# Patient Record
Sex: Female | Born: 1990 | Race: White | Hispanic: No | Marital: Single | Smoking: Current some day smoker
Health system: Southern US, Community
[De-identification: ages and names within clinical notes are randomized; demographics above are authoritative.]

## PROBLEM LIST (undated history)

## (undated) DIAGNOSIS — F32A Depression, unspecified: Secondary | ICD-10-CM

## (undated) DIAGNOSIS — F419 Anxiety disorder, unspecified: Secondary | ICD-10-CM

## (undated) HISTORY — DX: Depression, unspecified: F32.A

## (undated) HISTORY — DX: Anxiety disorder, unspecified: F41.9

## (undated) HISTORY — PX: WISDOM TOOTH EXTRACTION: SHX21

---

## 2015-03-24 ENCOUNTER — Encounter (HOSPITAL_COMMUNITY): Payer: Self-pay | Admitting: *Deleted

## 2015-03-24 ENCOUNTER — Emergency Department (HOSPITAL_COMMUNITY)
Admission: EM | Admit: 2015-03-24 | Discharge: 2015-03-24 | Disposition: A | Discharge status: Home / Self Care | Payer: 59 | Attending: Emergency Medicine | Admitting: Emergency Medicine

## 2015-03-24 ENCOUNTER — Emergency Department (HOSPITAL_COMMUNITY): Payer: 59

## 2015-03-24 DIAGNOSIS — Y998 Other external cause status: Secondary | ICD-10-CM | POA: Insufficient documentation

## 2015-03-24 DIAGNOSIS — S92332A Displaced fracture of third metatarsal bone, left foot, initial encounter for closed fracture: Secondary | ICD-10-CM | POA: Diagnosis not present

## 2015-03-24 DIAGNOSIS — Y9289 Other specified places as the place of occurrence of the external cause: Secondary | ICD-10-CM | POA: Insufficient documentation

## 2015-03-24 DIAGNOSIS — S92342A Displaced fracture of fourth metatarsal bone, left foot, initial encounter for closed fracture: Secondary | ICD-10-CM | POA: Diagnosis not present

## 2015-03-24 DIAGNOSIS — S99922A Unspecified injury of left foot, initial encounter: Secondary | ICD-10-CM | POA: Diagnosis present

## 2015-03-24 DIAGNOSIS — X58XXXA Exposure to other specified factors, initial encounter: Secondary | ICD-10-CM | POA: Insufficient documentation

## 2015-03-24 DIAGNOSIS — Z79899 Other long term (current) drug therapy: Secondary | ICD-10-CM | POA: Insufficient documentation

## 2015-03-24 DIAGNOSIS — S92352A Displaced fracture of fifth metatarsal bone, left foot, initial encounter for closed fracture: Secondary | ICD-10-CM | POA: Insufficient documentation

## 2015-03-24 DIAGNOSIS — Z793 Long term (current) use of hormonal contraceptives: Secondary | ICD-10-CM | POA: Insufficient documentation

## 2015-03-24 DIAGNOSIS — Y9339 Activity, other involving climbing, rappelling and jumping off: Secondary | ICD-10-CM | POA: Diagnosis not present

## 2015-03-24 DIAGNOSIS — S92302A Fracture of unspecified metatarsal bone(s), left foot, initial encounter for closed fracture: Secondary | ICD-10-CM

## 2015-03-24 MED ORDER — HYDROCODONE-ACETAMINOPHEN 5-325 MG PO TABS
2.0000 | ORAL_TABLET | Freq: Once | ORAL | Status: AC
  Administered 2015-03-24: 2 via ORAL
  Filled 2015-03-24: qty 2

## 2015-03-24 MED ORDER — OXYCODONE-ACETAMINOPHEN 5-325 MG PO TABS: 2.0000 | ORAL_TABLET | ORAL | Status: DC | PRN

## 2015-03-24 NOTE — ED Notes (Signed)
Pt jump off of something and landed on both feet. Pt denies pain upon impact however gradually pain and swelling increased to the left foot. Pt denies being able to ambulate on left foot d/t pain

## 2015-03-24 NOTE — Discharge Instructions (Signed)
Metatarsal Fracture A metatarsal fracture is a break in a metatarsal bone. Metatarsal bones connect your toe bones to your ankle bones. CAUSES This type of fracture may be caused by:  A sudden twisting of your foot.  A fall onto your foot.  Overuse or repetitive exercise. RISK FACTORS This condition is more likely to develop in people who:  Play contact sports.  Have a bone disease.  Have a low calcium level. SYMPTOMS Symptoms of this condition include:  Pain that is worse when walking or standing.  Pain when pressing on the foot or moving the toes.  Swelling.  Bruising on the top or bottom of the foot.  A foot that appears shorter than the other one. DIAGNOSIS This condition is diagnosed with a physical exam. You may also have imaging tests, such as:  X-rays.  A CT scan.  MRI. TREATMENT Treatment for this condition depends on its severity and whether a bone has moved out of place. Treatment may involve:  Rest.  Wearing foot support such as a cast, splint, or boot for several weeks.  Using crutches.  Surgery to move bones back into the right position. Surgery is usually needed if there are many pieces of broken bone or bones that are very out of place (displaced fracture).  Physical therapy. This may be needed to help you regain full movement and strength in your foot. You will need to return to your health care provider to have X-rays taken until your bones heal. Your health care provider will look at the X-rays to make sure that your foot is healing well. HOME CARE INSTRUCTIONS  If You Have a Cast:  Do not stick anything inside the cast to scratch your skin. Doing that increases your risk of infection.  Check the skin around the cast every day. Report any concerns to your health care provider. You may put lotion on dry skin around the edges of the cast. Do not apply lotion to the skin underneath the cast.  Keep the cast clean and dry. If You Have a Splint  or a Supportive Boot:  Wear it as directed by your health care provider. Remove it only as directed by your health care provider.  Loosen it if your toes become numb and tingle, or if they turn cold and blue.  Keep it clean and dry. Bathing  Do not take baths, swim, or use a hot tub until your health care provider approves. Ask your health care provider if you can take showers. You may only be allowed to take sponge baths for bathing.  If your health care provider approves bathing and showering, cover the cast or splint with a watertight plastic bag to protect it from water. Do not let the cast or splint get wet. Managing Pain, Stiffness, and Swelling  If directed, apply ice to the injured area (if you have a splint, not a cast).  Put ice in a plastic bag.  Place a towel between your skin and the bag.  Leave the ice on for 20 minutes, 2-3 times per day.  Move your toes often to avoid stiffness and to lessen swelling.  Raise (elevate) the injured area above the level of your heart while you are sitting or lying down. Driving  Do not drive or operate heavy machinery while taking pain medicine.  Do not drive while wearing foot support on a foot that you use for driving. Activity  Return to your normal activities as directed by your health care   provider. Ask your health care provider what activities are safe for you.  Perform exercises as directed by your health care provider or physical therapist. Safety  Do not use the injured foot to support your body weight until your health care provider says that you can. Use crutches as directed by your health care provider. General Instructions  Do not put pressure on any part of the cast or splint until it is fully hardened. This may take several hours.  Do not use any tobacco products, including cigarettes, chewing tobacco, or e-cigarettes. Tobacco can delay bone healing. If you need help quitting, ask your health care  provider.  Take medicines only as directed by your health care provider.  Keep all follow-up visits as directed by your health care provider. This is important. SEEK MEDICAL CARE IF:  You have a fever.  Your cast, splint, or boot is too loose or too tight.  Your cast, splint, or boot is damaged.  Your pain medicine is not helping.  You have pain, tingling, or numbness in your foot that is not going away. SEEK IMMEDIATE MEDICAL CARE IF:  You have severe pain.  You have tingling or numbness in your foot that is getting worse.  Your foot feels cold or becomes numb.  Your foot changes color.   This information is not intended to replace advice given to you by your health care provider. Make sure you discuss any questions you have with your health care provider.   Document Released: 09/20/2001 Document Revised: 05/15/2014 Document Reviewed: 10/25/2013 Elsevier Interactive Patient Education 2016 Elsevier Inc.  

## 2015-03-24 NOTE — ED Provider Notes (Signed)
CSN: [id number]     Arrival date & time 03/24/15  2023 History  By signing my name below, I, Doreatha Martinva Mathews, attest that this documentation has been prepared under the direction and in the presence of Khaleesi Gruel K. Despina Boan, PA-C. Electronically Signed: Doreatha MartinEva Mathews, ED Scribe. 03/24/2015. 10:06 PM.    No chief complaint on file.  The history is provided by the patient. No language interpreter was used.   HPI Comments: Karen Martin is a 25 y.o. female who presents to the Emergency Department complaining of moderate left lateral foot pain and swelling s/p injury that occurred today at 4PM. Pt states that she jumped up and landed on the lateral aspect of her left foot. She denies fall, LOC or head injury. Pt denies taking OTC medications at home to improve symptoms. She states that her pain is worsened with movement, weight bearing and ambulation. NKDA. She denies numbness, heel pain, additional injuries.    No past medical history on file. No past surgical history on file. No family history on file. Social History  Substance Use Topics  . Smoking status: Not on file  . Smokeless tobacco: Not on file  . Alcohol Use: Not on file   OB History    No data available     Review of Systems  Musculoskeletal: Positive for joint swelling and arthralgias.  Neurological: Negative for numbness.  All other systems reviewed and are negative.  Allergies  Review of patient's allergies indicates no known allergies.  Home Medications   Prior to Admission medications   Medication Sig Start Date End Date Taking? Authorizing Provider  albuterol (PROVENTIL HFA;VENTOLIN HFA) 108 (90 Base) MCG/ACT inhaler Inhale 1-2 puffs into the lungs every 6 (six) hours as needed for wheezing or shortness of breath (for exercise induce asthma).   Yes Historical Provider, MD  BIOTIN PO Take 1 tablet by mouth daily.   Yes Historical Provider, MD  drospirenone-ethinyl estradiol (YAZ,GIANVI,LORYNA) 3-0.02 MG tablet Take 1 tablet by  mouth daily.   Yes Historical Provider, MD   BP 139/91 mmHg  Pulse 59  Temp(Src) 98.3 F (36.8 C) (Oral)  Resp 16  SpO2 98% Physical Exam  Constitutional: She is oriented to person, place, and time. She appears well-developed and well-nourished.  HENT:  Head: Normocephalic and atraumatic.  Eyes: Conjunctivae and EOM are normal. Pupils are equal, round, and reactive to light.  Neck: Normal range of motion. Neck supple.  Cardiovascular: Normal rate.   Pulmonary/Chest: Effort normal. No respiratory distress.  Abdominal: She exhibits no distension.  Musculoskeletal: Normal range of motion. She exhibits tenderness.  TTP to the lateral left foot, specifically over the 5th metatarsal head.   Neurological: She is alert and oriented to person, place, and time.  Skin: Skin is warm and dry.  Psychiatric: She has a normal mood and affect. Her behavior is normal.  Nursing note and vitals reviewed.   ED Course  Procedures (including critical care time) DIAGNOSTIC STUDIES: Oxygen Saturation is 98% on RA, normal by my interpretation.    COORDINATION OF CARE: 10:03 PM Discussed treatment plan with pt at bedside which includes XR and pt agreed to plan.   Imaging Review No results found. I have personally reviewed and evaluated these images as part of my medical decision-making.  MDM   Final diagnoses:  None    Patient X-Ray showed minimally displaced fractures though the bases of the 3rd, 4th and 5th metatarsals, with likely intra-articular extension. Pt advised to follow up with orthopedics. Patient  given cam walker, crutches while in ED, conservative therapy recommended and discussed. Patient will be discharged home & is agreeable with above plan. Returns precautions discussed. Pt appears safe for discharge.    Medication List    TAKE these medications        oxyCODONE-acetaminophen 5-325 MG tablet  Commonly known as:  PERCOCET/ROXICET  Take 2 tablets by mouth every 4 (four) hours  as needed for severe pain.      ASK your doctor about these medications        albuterol 108 (90 Base) MCG/ACT inhaler  Commonly known as:  PROVENTIL HFA;VENTOLIN HFA  Inhale 1-2 puffs into the lungs every 6 (six) hours as needed for wheezing or shortness of breath (for exercise induce asthma).     BIOTIN PO  Take 1 tablet by mouth daily.     drospirenone-ethinyl estradiol 3-0.02 MG tablet  Commonly known as:  YAZ,GIANVI,LORYNA  Take 1 tablet by mouth daily.       Meds ordered this encounter  Medications  . drospirenone-ethinyl estradiol (YAZ,GIANVI,LORYNA) 3-0.02 MG tablet    Sig: Take 1 tablet by mouth daily.  Marland Kitchen BIOTIN PO    Sig: Take 1 tablet by mouth daily.  Marland Kitchen albuterol (PROVENTIL HFA;VENTOLIN HFA) 108 (90 Base) MCG/ACT inhaler    Sig: Inhale 1-2 puffs into the lungs every 6 (six) hours as needed for wheezing or shortness of breath (for exercise induce asthma).  Marland Kitchen HYDROcodone-acetaminophen (NORCO/VICODIN) 5-325 MG per tablet 2 tablet    Sig:   . oxyCODONE-acetaminophen (PERCOCET/ROXICET) 5-325 MG tablet    Sig: Take 2 tablets by mouth every 4 (four) hours as needed for severe pain.    Dispense:  20 tablet    Refill:  0    Order Specific Question:  Supervising Provider    Answer:  Jerelyn Scott [[id number]]    I personally performed the services in this documentation, which was scribed in my presence.  The recorded information has been reviewed and considered.   Barnet Pall.  Lonia Skinner [place], PA-C 03/24/15 [id number]  Arby Barrette, MD 03/27/15 1538

## 2015-07-24 ENCOUNTER — Other Ambulatory Visit: Payer: Self-pay | Admitting: Orthopaedic Surgery

## 2015-07-24 DIAGNOSIS — M79672 Pain in left foot: Secondary | ICD-10-CM

## 2015-08-04 ENCOUNTER — Inpatient Hospital Stay: Admission: RE | Admit: 2015-08-04 | Payer: 59 | Source: Ambulatory Visit

## 2015-08-22 ENCOUNTER — Ambulatory Visit (INDEPENDENT_AMBULATORY_CARE_PROVIDER_SITE_OTHER): Payer: 59 | Admitting: Psychiatry

## 2015-08-22 DIAGNOSIS — F1010 Alcohol abuse, uncomplicated: Secondary | ICD-10-CM

## 2015-08-22 DIAGNOSIS — F5002 Anorexia nervosa, binge eating/purging type: Secondary | ICD-10-CM

## 2015-08-22 DIAGNOSIS — Z720 Tobacco use: Secondary | ICD-10-CM

## 2015-09-03 ENCOUNTER — Ambulatory Visit: Payer: 59 | Admitting: Psychiatry

## 2017-05-03 ENCOUNTER — Encounter: Payer: Self-pay | Admitting: Family Medicine

## 2017-05-03 ENCOUNTER — Ambulatory Visit (INDEPENDENT_AMBULATORY_CARE_PROVIDER_SITE_OTHER): Payer: BLUE CROSS/BLUE SHIELD | Admitting: Family Medicine

## 2017-05-03 VITALS — BP 140/72 | HR 68 | Temp 98.2°F | Ht 71.25 in | Wt 216.0 lb

## 2017-05-03 DIAGNOSIS — Z7689 Persons encountering health services in other specified circumstances: Secondary | ICD-10-CM | POA: Diagnosis not present

## 2017-05-03 DIAGNOSIS — R0789 Other chest pain: Secondary | ICD-10-CM

## 2017-05-03 DIAGNOSIS — Z113 Encounter for screening for infections with a predominantly sexual mode of transmission: Secondary | ICD-10-CM | POA: Diagnosis not present

## 2017-05-03 LAB — TSH: TSH: 0.75 u[IU]/mL (ref 0.35–4.50)

## 2017-05-03 LAB — CBC
HCT: 39.3 % (ref 36.0–46.0)
Hemoglobin: 13.3 g/dL (ref 12.0–15.0)
MCHC: 33.9 g/dL (ref 30.0–36.0)
MCV: 95 fl (ref 78.0–100.0)
PLATELETS: 216 10*3/uL (ref 150.0–400.0)
RBC: 4.13 Mil/uL (ref 3.87–5.11)
RDW: 13.7 % (ref 11.5–15.5)
WBC: 5.5 10*3/uL (ref 4.0–10.5)

## 2017-05-03 LAB — T4, FREE: Free T4: 0.75 ng/dL (ref 0.60–1.60)

## 2017-05-03 NOTE — Progress Notes (Signed)
Patient presents to clinic today to establish care.  SUBJECTIVE: PMH: Pt is a 27 yo female with pmh sig for asthma, depression, bulimia, h/o shingles (May 2016), and fx L foot.  Pt was previously seen in [facility].  Chest discomfort: -pt endorses CP off and on x 1-2 months -noted more as a tightness in the middle of her chest that makes it feel like it is hard to take a deep breath -may happen once/ wk.   -pt denies numbness in arms or face.   -pt endorses an episode of emeses during the last time she had the chest discomfort. -pt denies h/o GERD.  Pt denies any recent binging or purging.  Allergies: None  Past Surgical hx: None  Social Hx: Pt is single.  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  Pt endorses alcohol and tobacco use.  She denies drug use.  Family Med Hx: Mom-alive, thyroid mass removed Dad- alive, alcohol abuse   History reviewed. No pertinent surgical history.  Current Outpatient Medications on File Prior to Visit  Medication Sig Dispense Refill  . drospirenone-ethinyl estradiol (YAZ,GIANVI,LORYNA) 3-0.02 MG tablet Take 1 tablet by mouth daily.    Marland Kitchen albuterol (PROVENTIL HFA;VENTOLIN HFA) 108 (90 Base) MCG/ACT inhaler Inhale 1-2 puffs into the lungs every 6 (six) hours as needed for wheezing or shortness of breath (for exercise induce asthma).    Marland Kitchen BIOTIN PO Take 1 tablet by mouth daily.    Marland Kitchen oxyCODONE-acetaminophen (PERCOCET/ROXICET) 5-325 MG tablet Take 2 tablets by mouth every 4 (four) hours as needed for severe pain. (Patient not taking: Reported on 05/03/2017) 20 tablet 0   No current facility-administered medications on file prior to visit.     No Known Allergies  History reviewed. No pertinent family history.  Social History   Socioeconomic History  . Marital status: Single    Spouse name: Not on file  . Number of children: Not on file  . Years of education: Not on file  . Highest education  level: Not on file  Occupational History  . Not on file  Social Needs  . Financial resource strain: Not on file  . Food insecurity:    Worry: Not on file    Inability: Not on file  . Transportation needs:    Medical: Not on file    Non-medical: Not on file  Tobacco Use  . Smoking status: Current Every Day Smoker    Packs/day: 1.00    Years: 8.00    Pack years: 8.00    Types: Cigarettes  . Smokeless tobacco: Never Used  Substance and Sexual Activity  . Alcohol use: Yes    Alcohol/week: 3.6 oz    Types: 6 Cans of beer per week    Comment: 5 times a week  . Drug use: No  . Sexual activity: Not on file  Lifestyle  . Physical activity:    Days per week: Not on file    Minutes per session: Not on file  . Stress: Not on file  Relationships  . Social connections:    Talks on phone: Not on file    Gets together: Not on file    Attends religious service: Not on file    Active member of club or organization: Not on file    Attends meetings of clubs or organizations: Not on file    Relationship status: Not on file  . Intimate partner violence:    Fear of current  or ex partner: Not on file    Emotionally abused: Not on file    Physically abused: Not on file    Forced sexual activity: Not on file  Other Topics Concern  . Not on file  Social History Narrative  . Not on file    ROS General: Denies fever, chills, night sweats, changes in weight, changes in appetite HEENT: Denies headaches, ear pain, changes in vision, rhinorrhea, sore throat CV: Denies CP, palpitations, SOB, orthopnea  +chest tightness Pulm: Denies SOB, cough, wheezing GI: Denies abdominal pain, nausea, vomiting, diarrhea, constipation GU: Denies dysuria, hematuria, frequency, vaginal discharge Msk: Denies muscle cramps, joint pains Neuro: Denies weakness, numbness, tingling Skin: Denies rashes, bruising Psych: Denies depression, anxiety, hallucinations  BP 140/72 (BP Location: Left Arm, Patient Position:  Sitting, Cuff Size: Normal)   Pulse 68   Temp 98.2 F (36.8 C) (Oral)   Ht 5' 11.25" (1.81 m)   Wt 216 lb (98 kg)   BMI 29.91 kg/m   Physical Exam Gen. Pleasant, well developed, well-nourished, in NAD HEENT - [place]/AT, PERRL, no scleral icterus, no nasal drainage, pharynx without erythema or exudate.  TMs normal bilaterally.  No cervical lymphadenopathy. Lungs: no use of accessory muscles, no dullness to percussion, CTAB, no wheezes, rales or rhonchi Cardiovascular: RRR, No r/g/m, no peripheral edema Abdomen: BS present, soft, nontender, nondistended MSK: no TTP of chest wall Neuro:  A&Ox3, CN II-XII intact, normal gait Skin:  Warm, dry, intact, no lesions Psych: normal affect, mood appropriate  No results found for this or any previous visit (from the past 2160 hour(s)).  Assessment/Plan: Chest tightness -discussed possible causes including anxiety, panic attack, asthma, etc. -pt reassured -PHQ 9 score 5 -GAD 7 score 3 -We will continue to monitor  - Plan: TSH, T4, free, CBC (no diff)  Routine screening for STI (sexually transmitted infection)  - Plan: RPR, HIV antibody (with reflex), C. trachomatis/N. gonorrhoeae RNA  Encounter to establish care -We reviewed the PMH, PSH, FH, SH, Meds and Allergies. -We provided refills for any medications we will prescribe as needed. -We addressed current concerns per orders and patient instructions. -We have asked for records for pertinent exams, studies, vaccines and notes from previous providers. -We have advised patient to follow up per instructions below.  F/u in 1 month, sooner if needed.  Abbe AmsterdamShannon Avary Eichenberger, MD

## 2017-05-03 NOTE — Patient Instructions (Signed)
Panic Attack A panic attack is a sudden episode of severe anxiety, fear, or discomfort that causes physical and emotional symptoms. The attack may be in response to something frightening, or it may occur for no known reason. Symptoms of a panic attack can be similar to symptoms of a heart attack or stroke. It is important to see your health care provider when you have a panic attack so that these conditions can be ruled out. A panic attack is a symptom of another condition. Most panic attacks go away with treatment of the underlying problem. If you have panic attacks often, you may have a condition called panic disorder. What are the causes? A panic attack may be caused by:  An extreme, life-threatening situation, such as a war or natural disaster.  An anxiety disorder, such as post-traumatic stress disorder.  Depression.  Certain medical conditions, including heart problems, neurological conditions, and infections.  Certain over-the-counter and prescription medicines.  Illegal drugs that increase heart rate and blood pressure, such as methamphetamine.  Alcohol.  Supplements that increase anxiety.  Panic disorder.  What increases the risk? You are more likely to develop this condition if:  You have an anxiety disorder.  You have another mental health condition.  You take certain medicines.  You use alcohol, illegal drugs, or other substances.  You are under extreme stress.  A life event is causing increased feelings of anxiety and depression.  What are the signs or symptoms? A panic attack starts suddenly, usually lasts about 20 minutes, and occurs with one or more of the following:  A pounding heart.  A feeling that your heart is beating irregularly or faster than normal (palpitations).  Sweating.  Trembling or shaking.  Shortness of breath or feeling smothered.  Feeling choked.  Chest pain or discomfort.  Nausea or a strange feeling in your  stomach.  Dizziness, feeling lightheaded, or feeling like you might faint.  Chills or hot flashes.  Numbness or tingling in your lips, hands, or feet.  Feeling confused, or feeling that you are not yourself.  Fear of losing control or being emotionally unstable.  Fear of dying.  How is this diagnosed? A panic attack is diagnosed with an assessment by your health care provider. During the assessment your health care provider will ask questions about:  Your history of anxiety, depression, and panic attacks.  Your medical history.  Whether you drink alcohol, use illegal drugs, take supplements, or take medicines. Be honest about your substance use.  Your health care provider may also:  Order blood tests or other kinds of tests to rule out serious medical conditions.  Refer you to a mental health professional for further evaluation.  How is this treated? Treatment depends on the cause of the panic attack:  If the cause is a medical problem, your health care provider will either treat that problem or refer you to a specialist.  If the cause is emotional, you may be given anti-anxiety medicines or referred to a counselor. These medicines may reduce how often attacks happen, reduce how severe the attacks are, and lower anxiety.  If the cause is a medicine, your health care provider may tell you to stop the medicine, change your dose, or take a different medicine.  If the cause is a drug, treatment may involve letting the drug wear off and taking medicine to help the drug leave your body or to counteract its effects. Attacks caused by drug abuse may continue even if you stop using   the drug.  Follow these instructions at home:  Take over-the-counter and prescription medicines only as told by your health care provider.  If you feel anxious, limit your caffeine intake.  Take good care of your physical and mental health by: ? Eating a balanced diet that includes plenty of fresh  fruits and vegetables, whole grains, lean meats, and low-fat dairy. ? Getting plenty of rest. Try to get 7-8 hours of uninterrupted sleep each night. ? Exercising regularly. Try to get 30 minutes of physical activity at least 5 days a week. ? Not smoking. Talk to your health care provider if you need help quitting. ? Limiting alcohol intake to no more than 1 drink a day for nonpregnant women and 2 drinks a day for men. One drink equals 12 oz of beer, 5 oz of wine, or 1 oz of hard liquor.  Keep all follow-up visits as told by your health care provider. This is important. Panic attacks may have underlying physical or emotional problems that take time to accurately diagnose. Contact a health care provider if:  Your symptoms do not improve, or they get worse.  You are not able to take your medicine as prescribed because of side effects. Get help right away if:  You have serious thoughts about hurting yourself or others.  You have symptoms of a panic attack. Do not drive yourself to the hospital. Have someone else drive you or call an ambulance. If you ever feel like you may hurt yourself or others, or you have thoughts about taking your own life, get help right away. You can go to your nearest emergency department or call:  Your local emergency services (911 in the [place]  A suicide crisis helpline, such as the National Suicide Prevention Lifeline at 1-800-273-8255. This is open 24 hours a day.  Summary  A panic attack is a sign of a serious health or mental health condition. Get help right away. Do not drive yourself to the hospital. Have someone else drive you or call an ambulance.  Always see a health care provider to have the reasons for the panic attack correctly diagnosed.  If your panic attack was caused by a physical problem, follow your health care provider's suggestions for medicine, referral to a specialist, and lifestyle changes.  If your panic attack was caused by an  emotional problem, follow through with counseling from a qualified mental health specialist.  If you feel like you may hurt yourself or others, call 911 and get help right away. This information is not intended to replace advice given to you by your health care provider. Make sure you discuss any questions you have with your health care provider. Document Released: 12/29/2004 Document Revised: 02/07/2016 Document Reviewed: 02/07/2016 Elsevier Interactive Patient Education  2018 Elsevier Inc.  

## 2017-05-04 LAB — HIV ANTIBODY (ROUTINE TESTING W REFLEX): HIV 1&2 Ab, 4th Generation: NONREACTIVE

## 2017-05-04 LAB — C. TRACHOMATIS/N. GONORRHOEAE RNA
C. TRACHOMATIS RNA, TMA: NOT DETECTED
N. gonorrhoeae RNA, TMA: NOT DETECTED

## 2017-05-04 LAB — RPR: RPR Ser Ql: NONREACTIVE

## 2017-05-05 ENCOUNTER — Encounter: Payer: Self-pay | Admitting: Family Medicine

## 2018-02-15 ENCOUNTER — Ambulatory Visit: Payer: BLUE CROSS/BLUE SHIELD | Admitting: Internal Medicine

## 2018-02-15 ENCOUNTER — Other Ambulatory Visit (HOSPITAL_COMMUNITY)
Admission: RE | Admit: 2018-02-15 | Discharge: 2018-02-15 | Disposition: A | Discharge status: Home / Self Care | Payer: BLUE CROSS/BLUE SHIELD | Source: Ambulatory Visit | Attending: Internal Medicine | Admitting: Internal Medicine

## 2018-02-15 ENCOUNTER — Encounter: Payer: Self-pay | Admitting: Internal Medicine

## 2018-02-15 VITALS — BP 120/64 | HR 77 | Temp 98.9°F | Wt 179.3 lb

## 2018-02-15 DIAGNOSIS — Z113 Encounter for screening for infections with a predominantly sexual mode of transmission: Secondary | ICD-10-CM | POA: Insufficient documentation

## 2018-02-15 DIAGNOSIS — Z79899 Other long term (current) drug therapy: Secondary | ICD-10-CM

## 2018-02-15 DIAGNOSIS — Z3041 Encounter for surveillance of contraceptive pills: Secondary | ICD-10-CM

## 2018-02-15 DIAGNOSIS — R110 Nausea: Secondary | ICD-10-CM

## 2018-02-15 LAB — POC URINALSYSI DIPSTICK (AUTOMATED)
BILIRUBIN UA: NEGATIVE
GLUCOSE UA: NEGATIVE
Leukocytes, UA: NEGATIVE
Nitrite, UA: NEGATIVE
Protein, UA: POSITIVE — AB
RBC UA: NEGATIVE
Urobilinogen, UA: 0.2 E.U./dL
pH, UA: 5.5 (ref 5.0–8.0)

## 2018-02-15 LAB — CBC WITH DIFFERENTIAL/PLATELET
BASOS ABS: 0 10*3/uL (ref 0.0–0.1)
Basophils Relative: 0.4 % (ref 0.0–3.0)
EOS ABS: 0 10*3/uL (ref 0.0–0.7)
EOS PCT: 0.1 % (ref 0.0–5.0)
HCT: 40.6 % (ref 36.0–46.0)
HEMOGLOBIN: 13.5 g/dL (ref 12.0–15.0)
Lymphocytes Relative: 10.4 % — ABNORMAL LOW (ref 12.0–46.0)
Lymphs Abs: 1.1 10*3/uL (ref 0.7–4.0)
MCHC: 33.3 g/dL (ref 30.0–36.0)
MCV: 95.1 fl (ref 78.0–100.0)
MONO ABS: 0.6 10*3/uL (ref 0.1–1.0)
Monocytes Relative: 5.7 % (ref 3.0–12.0)
NEUTROS PCT: 83.4 % — AB (ref 43.0–77.0)
Neutro Abs: 8.4 10*3/uL — ABNORMAL HIGH (ref 1.4–7.7)
Platelets: 211 10*3/uL (ref 150.0–400.0)
RBC: 4.27 Mil/uL (ref 3.87–5.11)
RDW: 13.3 % (ref 11.5–15.5)
WBC: 10.1 10*3/uL (ref 4.0–10.5)

## 2018-02-15 LAB — POCT URINE PREGNANCY: Preg Test, Ur: NEGATIVE

## 2018-02-15 MED ORDER — NORETHIN ACE-ETH ESTRAD-FE 1.5-30 MG-MCG PO TABS: 1.0000 | ORAL_TABLET | Freq: Every day | ORAL | 1 refills | Status: DC

## 2018-02-15 MED ORDER — ONDANSETRON 4 MG PO TBDP: 4.0000 mg | ORAL_TABLET | Freq: Three times a day (TID) | ORAL | 0 refills | Status: DC | PRN

## 2018-02-15 NOTE — Patient Instructions (Signed)
This may be a stomach bug that may run its course.   Nausea med if needed Lab today checking for infection sti screen   Check on ocps formulation.  Make future CPX with dr Salomon Fick.

## 2018-02-15 NOTE — Progress Notes (Signed)
Chief Complaint  Patient presents with  . Nausea    since last night. head ache and fatigue. pt would also like have to have std testing as peace of mind. Pt states that she is not pregnant      HPI: Karen Martin 28 y.o. come in for SDA  PCP dr Salomon Fick  NA   One day of sx.   Extreme nausea without vomiting  Fever cough uris no severe abd pain  No hx of same .  lmp none in 3 weeks  And no ocps of 1 week  Ran out and  pharmacy said needed ov . Ran out of  Pills .     Last menses .  Nl for her. Is on Junel ? Which one?   No fever. Vomiting   Achy some   5 year partner.  Denies risk of pregnancy .  But would like to check for STI screen.  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 Has not had the flu vaccine this year concern in the past that he could give the flu. ROS: See pertinent positives and negatives per HPI.  No current chest pain shortness of breath cough runny nose sore throat vomiting diarrhea unusual rashes.  Or acute neurologic sx   No past medical history on file.  No family history on file.  Social History   Socioeconomic History  . Marital status: Single    Spouse name: Not on file  . Number of children: Not on file  . Years of education: Not on file  . Highest education level: Not on file  Occupational History  . Not on file  Social Needs  . Financial resource strain: Not on file  . Food insecurity:    Worry: Not on file    Inability: Not on file  . Transportation needs:    Medical: Not on file    Non-medical: Not on file  Tobacco Use  . Smoking status: Current Every Day Smoker    Packs/day: 1.00    Years: 8.00    Pack years: 8.00    Types: Cigarettes  . Smokeless tobacco: Never Used  Substance and Sexual Activity  . Alcohol use: Yes    Alcohol/week: 6.0 standard drinks    Types: 6 Cans of beer per week    Comment: 5 times a week  . Drug use: No  . Sexual activity: Not  on file  Lifestyle  . Physical activity:    Days per week: Not on file    Minutes per session: Not on file  . Stress: Not on file  Relationships  . Social connections:    Talks on phone: Not on file    Gets together: Not on file    Attends religious service: Not on file    Active member of club or organization: Not on file    Attends meetings of clubs or organizations: Not on file    Relationship status: Not on file  Other Topics Concern  . Not on file  Social History Narrative  . Not on file    Outpatient Medications Prior to Visit  Medication Sig Dispense Refill  . drospirenone-ethinyl estradiol (YAZ,GIANVI,LORYNA) 3-0.02 MG tablet Take 1 tablet by mouth daily.    Marland Kitchen albuterol (PROVENTIL HFA;VENTOLIN HFA) 108 (90 Base) MCG/ACT inhaler Inhale 1-2 puffs into the lungs every 6 (six) hours as needed for wheezing or shortness of breath (for exercise induce  asthma).    Marland Kitchen BIOTIN PO Take 1 tablet by mouth daily.    Marland Kitchen oxyCODONE-acetaminophen (PERCOCET/ROXICET) 5-325 MG tablet Take 2 tablets by mouth every 4 (four) hours as needed for severe pain. (Patient not taking: Reported on 05/03/2017) 20 tablet 0   No facility-administered medications prior to visit.      EXAM:  BP 120/64 (BP Location: Right Arm, Patient Position: Sitting, Cuff Size: Normal)   Pulse 77   Temp 98.9 F (37.2 C) (Oral)   Wt 179 lb 4.8 oz (81.3 kg)   LMP 01/25/2018 (Within Days)   BMI 24.83 kg/m   Body mass index is 24.83 kg/m.  GENERAL: vitals reviewed and listed above, alert, oriented, appears well hydrated and in no acute distress HEENT: atraumatic, conjunctiva  clear, no obvious abnormalities on inspection of external nose and earstm clear  OP : no lesion edema or exudate  NECK: no obvious masses on inspection palpation  LUNGS: clear to auscultation bilaterally, no wheezes, rales or rhonchi, good air movement CV: HRRR, no clubbing cyanosis or  peripheral edema nl cap refill  Abdomen:  Sof,t normal bowel  sounds without hepatosplenomegaly, no guarding rebound or masses no CVA tenderness  Skin nl tatoos  Nl cap refill  MS: moves all extremities without noticeable focal  abnormality PSYCH: pleasant and cooperative, no obvious depression or anxiety Lab Results  Component Value Date   WBC 5.5 05/03/2017   HGB 13.3 05/03/2017   HCT 39.3 05/03/2017   PLT 216.0 05/03/2017   TSH 0.75 05/03/2017   BP Readings from Last 3 Encounters:  02/15/18 120/64  05/03/17 140/72  03/24/15 121/75    ASSESSMENT AND PLAN:  Discussed the following assessment and plan:  Nausea - Plan: CBC with Differential/Platelet, POCT Urinalysis Dipstick (Automated), POCT urine pregnancy  Routine screening for STI (sexually transmitted infection) - Plan: CBC with Differential/Platelet, HIV Antibody (routine testing w rflx), RPR, Urine cytology ancillary only, POCT Urinalysis Dipstick (Automated), POCT urine pregnancy  Medication management  Oral contraceptive use  ran out  see text  - says on junel  not sure which dose so sent in 1 30 ocp refill  Uncertain  cause    Of her sx today  Expectant management. No resp sx like flu . Didn't get flu vaccine  Counseled. About benefit more than risk  Lab etc   cbcd today  -Patient advised to return or notify health care team  if  new concerns arise.  Patient Instructions  This may be a stomach bug that may run its course.   Nausea med if needed Lab today checking for infection sti screen   Check on ocps formulation.  Make future CPX with dr Salomon Fick.  Neta Mends. Jaymi Tinner M.D.

## 2018-02-16 ENCOUNTER — Telehealth: Payer: Self-pay

## 2018-02-16 LAB — RPR: RPR: NONREACTIVE

## 2018-02-16 LAB — HIV ANTIBODY (ROUTINE TESTING W REFLEX): HIV: NONREACTIVE

## 2018-02-16 LAB — URINE CYTOLOGY ANCILLARY ONLY
CHLAMYDIA, DNA PROBE: NEGATIVE
NEISSERIA GONORRHEA: NEGATIVE

## 2018-02-16 NOTE — Telephone Encounter (Signed)
Please advise   Copied from CRM [id number]. Topic: Quick Communication - Other Results (Clinic Use ONLY) >> Feb 16, 2018  9:13 AM Karen Martin wrote: Pt calling back for results of UA done yesterday.  Pt states she is really fatigued and thought she may have kidney infection. Pt saw Dr Fabian Sharp yesterday.

## 2018-02-16 NOTE — Telephone Encounter (Signed)
Pt was seen by Panosh.

## 2018-02-16 NOTE — Telephone Encounter (Signed)
Pt is calling in and stating she thinks might have had a kidney infection and had recently had sex Monday so pt is unsure if test will be accurate pt is worried and states nothing is getting better. Please advise

## 2018-02-16 NOTE — Telephone Encounter (Signed)
Called pt back after speaking with dr.Panosh offered to have her come in and do A UA and send it off to culture and to follow up with Dr.Banks tomorrow. Pt states she has to return back to work tomorrow and if it continues she will follow up with Dr.Banks

## 2018-02-16 NOTE — Telephone Encounter (Signed)
Pt is anxious for results as she is not feeling well. Even thinking she should come back in as she is concerned she may have kidney infection.  Pt would liek results released to my chart. Advised pt still waiting on Dr Fabian Sharp to review  Pt saw Dr Fabian Sharp who will need to release the results

## 2018-02-21 ENCOUNTER — Telehealth: Payer: Self-pay | Admitting: Family Medicine

## 2018-02-21 NOTE — Telephone Encounter (Signed)
Copied from CRM [id number]. Topic: General - Other >> Feb 21, 2018  1:43 PM Gerrianne Scale wrote: Reason for CRM: pt calling about lab results wanting someone to go over it with her she saw it in my chart

## 2018-02-21 NOTE — Telephone Encounter (Signed)
Returned call to pt.  Questioned about the UA results visible in MyChart.  Reported she noted that UA was positive for protein, and questioned what Dr. Salomon Fick feels this is related to?  Also, stated she wanted to make Dr. Salomon Fick aware that she had sexual intercourse 2 days prior to STD testing, and questioned if the testing would be accurate, if taken that soon in reference to having intercourse?  Pt. Is very eager to learn the results of the STD tests.  Advised that Dr. Salomon Fick has not reviewed / signed off on the STD testing, but will make her aware of pt's questions and concerns.

## 2018-02-21 NOTE — Telephone Encounter (Signed)
Labs were not ordered by this provider.

## 2018-02-22 NOTE — Telephone Encounter (Signed)
Resending note to [facility]; PCP did not order the recent labs.  Please review with Dr. Fabian Sharp and contact the pt.

## 2018-02-22 NOTE — Telephone Encounter (Signed)
Released  Lab results that are good. So she can view and  You can tell her from result note reviewed  Hope she is better   If any concerns fu with pcp

## 2018-04-08 ENCOUNTER — Other Ambulatory Visit: Payer: Self-pay | Admitting: Internal Medicine

## 2018-05-04 ENCOUNTER — Other Ambulatory Visit: Payer: Self-pay | Admitting: Internal Medicine

## 2018-05-18 ENCOUNTER — Telehealth: Payer: BLUE CROSS/BLUE SHIELD | Admitting: Nurse Practitioner

## 2018-05-18 DIAGNOSIS — R6889 Other general symptoms and signs: Secondary | ICD-10-CM | POA: Diagnosis not present

## 2018-05-18 DIAGNOSIS — M791 Myalgia, unspecified site: Secondary | ICD-10-CM

## 2018-05-18 NOTE — Progress Notes (Signed)
E visit for Flu like symptoms   We are sorry that you are not feeling well.  Here is how we plan to help! Based on what you have shared with me it looks like you may have flu-like symptoms that should be watched but do not seem to indicate anti-viral treatment.  Influenza or "the flu" is   an infection caused by a respiratory virus. The flu virus is highly contagious and persons who did not receive their yearly flu vaccination may "catch" the flu from close contact.  We have anti-viral medications to treat the viruses that cause this infection. They are not a "cure" and only shorten the course of the infection. These prescriptions are most effective when they are given within the first 2 days of "flu" symptoms. Antiviral medication are indicated if you have a high risk of complications from the flu. You should  also consider an antiviral medication if you are in close contact with someone who is at risk. These medications can help patients avoid complications from the flu  but have side effects that you should know. Possible side effects from Tamiflu or oseltamivir include nausea, vomiting, diarrhea, dizziness, headaches, eye redness, sleep problems or other respiratory symptoms.   Based upon your symptoms and potential risk factors I recommend that you follow the flu symptoms recommendation that I have listed below.  ANYONE WHO HAS FLU SYMPTOMS SHOULD: . Stay home. The flu is highly contagious and going out or to work exposes others! . Be sure to drink plenty of fluids. Water is fine as well as fruit juices, sodas and electrolyte beverages. You may want to stay away from caffeine or alcohol. If you are nauseated, try taking small sips of liquids. How do you know if you are getting enough fluid? Your urine should be a pale yellow or almost colorless. . Get rest. . Taking a steamy shower or using a humidifier may help nasal congestion and ease sore throat pain. Using a saline nasal spray works much  the same way. . Cough drops, hard candies and sore throat lozenges may ease your cough. . Line up a caregiver. Have someone check on you regularly.   GET HELP RIGHT AWAY IF: . You cannot keep down liquids or your medications. . You become short of breath . Your fell like you are going to pass out or loose consciousness. . Your symptoms persist after you have completed your treatment plan MAKE SURE YOU   Understand these instructions.  Will watch your condition.  Will get help right away if you are not doing well or get worse.  Your e-visit answers were reviewed by a board certified advanced clinical practitioner to complete your personal care plan.  Depending on the condition, your plan could have included both over the counter or prescription medications.  If there is a problem please reply  once you have received a response from your provider.  Your safety is important to us.  If you have drug allergies check your prescription carefully.    You can use MyChart to ask questions about today's visit, request a non-urgent call back, or ask for a work or school excuse for 24 hours related to this e-Visit. If it has been greater than 24 hours you will need to follow up with your provider, or enter a new e-Visit to address those concerns.  You will get an e-mail in the next two days asking about your experience.  I hope that your e-visit has been valuable  and will speed your recovery. Thank you for using e-visits.  5-10 minutes spent reviewing and documenting in chart.

## 2018-05-28 ENCOUNTER — Other Ambulatory Visit: Payer: Self-pay | Admitting: Internal Medicine

## 2018-05-30 ENCOUNTER — Other Ambulatory Visit: Payer: Self-pay

## 2018-05-30 NOTE — Telephone Encounter (Signed)
Dr. Banks patient  

## 2018-06-28 ENCOUNTER — Other Ambulatory Visit: Payer: Self-pay

## 2018-06-28 ENCOUNTER — Other Ambulatory Visit: Payer: Self-pay | Admitting: Family Medicine

## 2018-07-25 ENCOUNTER — Other Ambulatory Visit: Payer: Self-pay | Admitting: Family Medicine

## 2018-08-25 ENCOUNTER — Other Ambulatory Visit: Payer: Self-pay | Admitting: Family Medicine

## 2018-09-23 ENCOUNTER — Other Ambulatory Visit: Payer: Self-pay | Admitting: Family Medicine

## 2018-09-23 NOTE — Telephone Encounter (Signed)
Pt needs appointment for further refills 

## 2018-12-05 ENCOUNTER — Telehealth: Payer: Self-pay | Admitting: Family Medicine

## 2018-12-05 NOTE — Telephone Encounter (Signed)
Medication Refill - Medication: drospirenone-ethinyl estradiol (YAZ,GIANVI,LORYNA) 3-0.02 MG tablet    Has the patient contacted their pharmacy? Yes.   (Agent: If no, request that the patient contact the pharmacy for the refill.) (Agent: If yes, whe [id number] - [address], [place] [phone] (Phone) [phone] (Fax)   n and what did the pharmacy advise?)  Preferred Pharmacy (with phone number or street name):   Agent: Please be advised that RX refills may take up to 3 business days. We ask that you follow-up with your pharmacy.

## 2018-12-06 NOTE — Telephone Encounter (Signed)
Left pt a detailed message to call the office and schedule appointment for the birth control

## 2018-12-07 ENCOUNTER — Telehealth (INDEPENDENT_AMBULATORY_CARE_PROVIDER_SITE_OTHER): Payer: BC Managed Care – PPO | Admitting: Family Medicine

## 2018-12-07 DIAGNOSIS — Z308 Encounter for other contraceptive management: Secondary | ICD-10-CM | POA: Diagnosis not present

## 2018-12-07 MED ORDER — NORETHIN ACE-ETH ESTRAD-FE 1.5-30 MG-MCG PO TABS: 1.0000 | ORAL_TABLET | Freq: Every day | ORAL | 6 refills | Status: DC

## 2018-12-07 NOTE — Progress Notes (Signed)
Virtual Visit via Video Note Attempted to contact pt via doxy, however unable 2/2 poor connection.  Pt called via phone. I connected with Rosalea Withrow on 12/07/18 at  1:30 PM EST by a video enabled telemedicine application 2/2 [id number] pandemic and verified that I am speaking with the correct person using two identifiers.  Location patient: car Location provider:work or home office Persons participating in the virtual visit: patient, provider  I discussed the limitations of evaluation and management by telemedicine and the availability of in person appointments. The patient expressed understanding and agreed to proceed.   HPI: Pt requesting refill on Blisovi birth control.  Pt having regular menses monthly.  Endorses lighter periods on OCPs, but duration about the same.  Pt has been off OCPs x 1.5 months.  Denies SOB, LE edema, h/o PE or DVT, tobacco use.   ROS: See pertinent positives and negatives per HPI.  No past medical history on file.  No past surgical history on file.  No family history on file.    Current Outpatient Medications:  .  albuterol (PROVENTIL HFA;VENTOLIN HFA) 108 (90 Base) MCG/ACT inhaler, Inhale 1-2 puffs into the lungs every 6 (six) hours as needed for wheezing or shortness of breath (for exercise induce asthma)., Disp: , Rfl:  .  BIOTIN PO, Take 1 tablet by mouth daily., Disp: , Rfl:  .  BLISOVI FE 1.5/30 1.5-30 MG-MCG tablet, TAKE 1 TABLET BY MOUTH DAILY, Disp: 28 tablet, Rfl: 0 .  drospirenone-ethinyl estradiol (YAZ,GIANVI,LORYNA) 3-0.02 MG tablet, Take 1 tablet by mouth daily., Disp: , Rfl:  .  ondansetron (ZOFRAN-ODT) 4 MG disintegrating tablet, Take 1 tablet (4 mg total) by mouth every 8 (eight) hours as needed for nausea or vomiting., Disp: 15 tablet, Rfl: 0 .  oxyCODONE-acetaminophen (PERCOCET/ROXICET) 5-325 MG tablet, Take 2 tablets by mouth every 4 (four) hours as needed for severe pain. (Patient not taking: Reported on 05/03/2017), Disp: 20 tablet,  Rfl: 0  EXAM:  VITALS per patient if applicable:   GENERAL: alert, oriented, sounds well and in no acute distress  PSYCH/NEURO: pleasant and cooperative, no obvious depression or anxiety, speech and thought processing grossly intact  ASSESSMENT AND PLAN:  Discussed the following assessment and plan:  Encounter for other contraceptive management  - Plan: norethindrone-ethinyl estradiol-iron (BLISOVI FE 1.5/30) 1.5-30 MG-MCG tablet  Pt to schedule CPE when able in the next few months.   I discussed the assessment and treatment plan with the patient. The patient was provided an opportunity to ask questions and all were answered. The patient agreed with the plan and demonstrated an understanding of the instructions.   The patient was advised to call back or seek an in-person evaluation if the symptoms worsen or if the condition fails to improve as anticipated.  I provided 7 minutes of non-face-to-face time during this encounter.   Billie Ruddy, MD

## 2018-12-20 ENCOUNTER — Other Ambulatory Visit: Payer: Self-pay

## 2018-12-20 DIAGNOSIS — Z20822 Contact with and (suspected) exposure to covid-19: Secondary | ICD-10-CM

## 2018-12-21 NOTE — Telephone Encounter (Signed)
Pt is scheduled for in office visit on 12/22/2018 at 8.30 am

## 2018-12-22 ENCOUNTER — Encounter: Payer: Self-pay | Admitting: Family Medicine

## 2018-12-22 ENCOUNTER — Other Ambulatory Visit (HOSPITAL_COMMUNITY)
Admission: RE | Admit: 2018-12-22 | Discharge: 2018-12-22 | Disposition: A | Discharge status: Home / Self Care | Payer: BC Managed Care – PPO | Source: Ambulatory Visit | Attending: Family Medicine | Admitting: Family Medicine

## 2018-12-22 ENCOUNTER — Ambulatory Visit: Payer: BC Managed Care – PPO | Admitting: Family Medicine

## 2018-12-22 ENCOUNTER — Other Ambulatory Visit: Payer: Self-pay

## 2018-12-22 VITALS — BP 120/62 | HR 85 | Temp 97.8°F | Wt 200.8 lb

## 2018-12-22 DIAGNOSIS — N760 Acute vaginitis: Secondary | ICD-10-CM | POA: Diagnosis not present

## 2018-12-22 DIAGNOSIS — Z113 Encounter for screening for infections with a predominantly sexual mode of transmission: Secondary | ICD-10-CM

## 2018-12-22 DIAGNOSIS — Z124 Encounter for screening for malignant neoplasm of cervix: Secondary | ICD-10-CM | POA: Insufficient documentation

## 2018-12-22 LAB — NOVEL CORONAVIRUS, NAA: SARS-CoV-2, NAA: NOT DETECTED

## 2018-12-22 LAB — POCT URINALYSIS DIPSTICK
Bilirubin, UA: NEGATIVE
Blood, UA: NEGATIVE
Glucose, UA: NEGATIVE
Ketones, UA: NEGATIVE
Leukocytes, UA: NEGATIVE
Nitrite, UA: NEGATIVE
Protein, UA: NEGATIVE
Spec Grav, UA: >=1.03 — AB (ref 1.010–1.025)
Urobilinogen, UA: 0.2 E.U./dL
pH, UA: 5.5 (ref 5.0–8.0)

## 2018-12-22 MED ORDER — METRONIDAZOLE 500 MG PO TABS: 500.0000 mg | ORAL_TABLET | Freq: Two times a day (BID) | ORAL | 0 refills | Status: AC

## 2018-12-22 NOTE — Patient Instructions (Addendum)
Vaginitis Vaginitis is a condition in which the vaginal tissue swells and becomes red (inflamed). This condition is most often caused by a change in the normal balance of bacteria and yeast that live in the vagina. This change causes an overgrowth of certain bacteria or yeast, which causes the inflammation. There are different types of vaginitis, but the most common types are:  Bacterial vaginosis.  Yeast infection (candidiasis).  Trichomoniasis vaginitis. This is a sexually transmitted disease (STD).  Viral vaginitis.  Atrophic vaginitis.  Allergic vaginitis. What are the causes? The cause of this condition depends on the type of vaginitis. It can be caused by:  Bacteria (bacterial vaginosis).  Yeast, which is a fungus (yeast infection).  A parasite (trichomoniasis vaginitis).  A virus (viral vaginitis).  Low hormone levels (atrophic vaginitis). Low hormone levels can occur during pregnancy, breastfeeding, or after menopause.  Irritants, such as bubble baths, scented tampons, and feminine sprays (allergic vaginitis). Other factors can change the normal balance of the yeast and bacteria that live in the vagina. These include:  Antibiotic medicines.  Poor hygiene.  Diaphragms, vaginal sponges, spermicides, birth control pills, and intrauterine devices (IUD).  Sex.  Infection.  Uncontrolled diabetes.  A weakened defense (immune) system. What increases the risk? This condition is more likely to develop in women who:  Smoke.  Use vaginal douches, scented tampons, or scented sanitary pads.  Wear tight-fitting pants.  Wear thong underwear.  Use oral birth control pills or an IUD.  Have sex without a condom.  Have multiple sex partners.  Have an STD.  Frequently use the spermicide nonoxynol-9.  Eat lots of foods high in sugar.  Have uncontrolled diabetes.  Have low estrogen levels.  Have a weakened immune system from an immune disorder or medical  treatment.  Are pregnant or breastfeeding. What are the signs or symptoms? Symptoms vary depending on the cause of the vaginitis. Common symptoms include:  Abnormal vaginal discharge. ? The discharge is white, gray, or yellow with bacterial vaginosis. ? The discharge is thick, white, and cheesy with a yeast infection. ? The discharge is frothy and yellow or greenish with trichomoniasis.  A bad vaginal smell. The smell is fishy with bacterial vaginosis.  Vaginal itching, pain, or swelling.  Sex that is painful.  Pain or burning when urinating. Sometimes there are no symptoms. How is this diagnosed? This condition is diagnosed based on your symptoms and medical history. A physical exam, including a pelvic exam, will also be done. You may also have other tests, including:  Tests to determine the pH level (acidity or alkalinity) of your vagina.  A whiff test, to assess the odor that results when a sample of your vaginal discharge is mixed with a potassium hydroxide solution.  Tests of vaginal fluid. A sample will be examined under a microscope. How is this treated? Treatment varies depending on the type of vaginitis you have. Your treatment may include:  Antibiotic creams or pills to treat bacterial vaginosis and trichomoniasis.  Antifungal medicines, such as vaginal creams or suppositories, to treat a yeast infection.  Medicine to ease discomfort if you have viral vaginitis. Your sexual partner should also be treated.  Estrogen delivered in a cream, pill, suppository, or vaginal ring to treat atrophic vaginitis. If vaginal dryness occurs, lubricants and moisturizing creams may help. You may need to avoid scented soaps, sprays, or douches.  Stopping use of a product that is causing allergic vaginitis. Then using a vaginal cream to treat the symptoms. Follow   these instructions at home: Lifestyle  Keep your genital area clean and dry. Avoid soap, and only rinse the area with  water.  Do not douche or use tampons until your health care provider says it is okay to do so. Use sanitary pads, if needed.  Do not have sex until your health care provider approves. When you can return to sex, practice safe sex and use condoms.  Wipe from front to back. This avoids the spread of bacteria from the rectum to the vagina. General instructions  Take over-the-counter and prescription medicines only as told by your health care provider.  If you were prescribed an antibiotic medicine, take or use it as told by your health care provider. Do not stop taking or using the antibiotic even if you start to feel better.  Keep all follow-up visits as told by your health care provider. This is important. How is this prevented?  Use mild, non-scented products. Do not use things that can irritate the vagina, such as fabric softeners. Avoid the following products if they are scented: ? Feminine sprays. ? Detergents. ? Tampons. ? Feminine hygiene products. ? Soaps or bubble baths.  Let air reach your genital area. ? Wear cotton underwear to reduce moisture buildup. ? Avoid wearing underwear while you sleep. ? Avoid wearing tight pants and underwear or nylons without a cotton panel. ? Avoid wearing thong underwear.  Take off any wet clothing, such as bathing suits, as soon as possible.  Practice safe sex and use condoms. Contact a health care provider if:  You have abdominal pain.  You have a fever.  You have symptoms that last for more than 2-3 days. Get help right away if:  You have a fever and your symptoms suddenly get worse. Summary  Vaginitis is a condition in which the vaginal tissue becomes inflamed.This condition is most often caused by a change in the normal balance of bacteria and yeast that live in the vagina.  Treatment varies depending on the type of vaginitis you have.  Do not douche, use tampons , or have sex until your health care provider approves. When  you can return to sex, practice safe sex and use condoms. This information is not intended to replace advice given to you by your health care provider. Make sure you discuss any questions you have with your health care provider. Document Released: 10/26/2006 Document Revised: 12/11/2016 Document Reviewed: 02/04/2016 Elsevier Patient Education  2020 [employer].  Pap Test Why am I having this test? A Pap test, also called a Pap smear, is a screening test to check for signs of:  Cancer of the vagina, cervix, and uterus. The cervix is the lower part of the uterus that opens into the vagina.  Infection.  Changes that may be a sign that cancer is developing (precancerous changes). Women need this test on a regular basis. In general, you should have a Pap test every 3 years until you reach menopause or age 18. Women aged 30-60 may choose to have their Pap test done at the same time as an HPV (human papillomavirus) test every 5 years (instead of every 3 years). Your health care provider may recommend having Pap tests more or less often depending on your medical conditions and past Pap test results. What kind of sample is taken?  Your health care provider will collect a sample of cells from the surface of your cervix. This will be done using a small cotton swab, plastic spatula, or brush. This  sample is often collected during a pelvic exam, when you are lying on your back on an exam table with feet in footrests (stirrups). In some cases, fluids (secretions) from the cervix or vagina may also be collected. How do I prepare for this test?  Be aware of where you are in your menstrual cycle. If you are menstruating on the day of the test, you may be asked to reschedule.  You may need to reschedule if you have a known vaginal infection on the day of the test.  Follow instructions from your health care provider about: ? Changing or stopping your regular medicines. Some medicines can cause abnormal  test results, such as digitalis and tetracycline. ? Avoiding douching or taking a bath the day before or the day of the test. Tell a health care provider about:  Any allergies you have.  All medicines you are taking, including vitamins, herbs, eye drops, creams, and over-the-counter medicines.  Any blood disorders you have.  Any surgeries you have had.  Any medical conditions you have.  Whether you are pregnant or may be pregnant. How are the results reported? Your test results will be reported as either abnormal or normal. A false-positive result can occur. A false positive is incorrect because it means that a condition is present when it is not. A false-negative result can occur. A false negative is incorrect because it means that a condition is not present when it is. What do the results mean? A normal test result means that you do not have signs of cancer of the vagina, cervix, or uterus. An abnormal result may mean that you have:  Cancer. A Pap test by itself is not enough to diagnose cancer. You will have more tests done in this case.  Precancerous changes in your vagina, cervix, or uterus.  Inflammation of the cervix.  An STD (sexually transmitted disease).  A fungal infection.  A parasite infection. Talk with your health care provider about what your results mean. Questions to ask your health care provider Ask your health care provider, or the department that is doing the test:  When will my results be ready?  How will I get my results?  What are my treatment options?  What other tests do I need?  What are my next steps? Summary  In general, women should have a Pap test every 3 years until they reach menopause or age 28.  Your health care provider will collect a sample of cells from the surface of your cervix. This will be done using a small cotton swab, plastic spatula, or brush.  In some cases, fluids (secretions) from the cervix or vagina may also be  collected. This information is not intended to replace advice given to you by your health care provider. Make sure you discuss any questions you have with your health care provider. Document Released: 03/21/2002 Document Revised: 09/07/2016 Document Reviewed: 09/07/2016 Elsevier Patient Education  2020 [employer]  Bacterial Vaginosis  Bacterial vaginosis is an infection of the vagina. It happens when too many normal germs (healthy bacteria) grow in the vagina. This infection puts you at risk for infections from sex (STIs). Treating this infection can lower your risk for some STIs. You should also treat this if you are pregnant. It can cause your baby to be born early. Follow these instructions at home: Medicines  Take over-the-counter and prescription medicines only as told by your doctor.  Take or use your antibiotic medicine as told by your doctor.  Do not stop taking or using it even if you start to feel better. General instructions  If you your sexual partner is a woman, tell her that you have this infection. She needs to get treatment if she has symptoms. If you have a female partner, he does not need to be treated.  During treatment: ? Avoid sex. ? Do not douche. ? Avoid alcohol as told. ? Avoid breastfeeding as told.  Drink enough fluid to keep your pee (urine) clear or pale yellow.  Keep your vagina and butt (rectum) clean. ? Wash the area with warm water every day. ? Wipe from front to back after you use the toilet.  Keep all follow-up visits as told by your doctor. This is important. Preventing this condition  Do not douche.  Use only warm water to wash around your vagina.  Use protection when you have sex. This includes: ? Latex condoms. ? Dental dams.  Limit how many people you have sex with. It is best to only have sex with the same person (be monogamous).  Get tested for STIs. Have your partner get tested.  Wear underwear that is cotton or lined with  cotton.  Avoid tight pants and pantyhose. This is most important in summer.  Do not use any products that have nicotine or tobacco in them. These include cigarettes and e-cigarettes. If you need help quitting, ask your doctor.  Do not use illegal drugs.  Limit how much alcohol you drink. Contact a doctor if:  Your symptoms do not get better, even after you are treated.  You have more discharge or pain when you pee (urinate).  You have a fever.  You have pain in your belly (abdomen).  You have pain with sex.  Your bleed from your vagina between periods. Summary  This infection happens when too many germs (bacteria) grow in the vagina.  Treating this condition can lower your risk for some infections from sex (STIs).  You should also treat this if you are pregnant. It can cause early (premature) birth.  Do not stop taking or using your antibiotic medicine even if you start to feel better. This information is not intended to replace advice given to you by your health care provider. Make sure you discuss any questions you have with your health care provider. Document Released: 10/08/2007 Document Revised: 12/11/2016 Document Reviewed: 09/14/2015 Elsevier Patient Education  2020 [employer].

## 2018-12-22 NOTE — Progress Notes (Signed)
Subjective:    Patient ID: Karen Martin, female    DOB: 1990/12/16, 28 y.o.   MRN: [id number]  Chief Complaint  Patient presents with  . Vaginal Discharge    pt states she has been having a vaginal discharge and discomfort and irritation for 3 days    HPI Patient was seen today for acute concern.  Pt with vaginal d/c, irritation, and burning sensation since Monday evening.  Pt also with mild abdominal cramping pt denies smell, nausea, vomiting, back pain.  Endorses unprotected sex 1 month ago.  LMP 11/27.  Has a history of low back pain.  Last episode 2 weeks ago.  Patient has never had a Pap.  No past medical history on file.  No Known Allergies  ROS General: Denies fever, chills, night sweats, changes in weight, changes in appetite HEENT: Denies headaches, ear pain, changes in vision, rhinorrhea, sore throat CV: Denies CP, palpitations, SOB, orthopnea Pulm: Denies SOB, cough, wheezing GI: Denies abdominal pain, nausea, vomiting, diarrhea, constipation GU: Denies dysuria, hematuria, frequency  +vaginal discharge/irritation Msk: Denies muscle cramps, joint pains Neuro: Denies weakness, numbness, tingling Skin: Denies rashes, bruising Psych: Denies depression, anxiety, hallucinations    Objective:    Blood pressure 120/62, pulse 85, temperature 97.8 F (36.6 C), temperature source Temporal, weight 200 lb 12.8 oz (91.1 kg), last menstrual period 12/09/2018, SpO2 99 %.   Gen. Pleasant, well-nourished, in no distress, normal affect   HEENT: [place]/AT, face symmetric, conjunctiva clear, no scleral icterus, PERRLA, nares patent without drainage Lungs: no accessory muscle use Cardiovascular: RRR, noperipheral edema Abdomen: BS present, soft, NT/ND GU: normal external genitalia, urethral meatus normal.  Vagina with erythema, no lesions, cervix with thin white d/c, scant amount of blood noted at os with collection on pap, Uterus normal size, No Adnexal masses.  Normal Anus and  perineum.  Pap collected.  No CVA tenderness Neuro:  A&Ox3, CN II-XII intact, normal gait Skin:  Warm, no lesions/ rash   Wt Readings from Last 3 Encounters:  12/22/18 200 lb 12.8 oz (91.1 kg)  02/15/18 179 lb 4.8 oz (81.3 kg)  05/03/17 216 lb (98 kg)    Lab Results  Component Value Date   WBC 10.1 02/15/2018   HGB 13.5 02/15/2018   HCT 40.6 02/15/2018   PLT 211.0 02/15/2018   TSH 0.75 05/03/2017    Assessment/Plan:  Acute vaginitis  -suspect BV based on exam -will treat with Flagyl. Will change abx based on lab results. -Given handout -Given precautions --UA negative Sg 1.030 - Plan: POC Urinalysis Dipstick, PAP [[place]], metroNIDAZOLE (FLAGYL) 500 MG tablet  Cervical cancer screening  - Plan: PAP [[place]]  Routine screening for STI (sexually transmitted infection)  - Plan: RPR, HIV antibody (with reflex)  F/u as needed  Grier Mitts, MD  This note is not being shared with the patient for the following reason: To prevent harm (release of this note would result in harm to the life or physical safety of the patient or another).

## 2018-12-23 LAB — HIV ANTIBODY (ROUTINE TESTING W REFLEX): HIV 1&2 Ab, 4th Generation: NONREACTIVE

## 2018-12-23 LAB — RPR: RPR Ser Ql: NONREACTIVE

## 2018-12-28 LAB — CYTOLOGY - PAP
Chlamydia: NEGATIVE
Comment: NEGATIVE
Comment: NEGATIVE
Comment: NEGATIVE
Comment: NORMAL
Diagnosis: UNDETERMINED — AB
High risk HPV: NEGATIVE
Neisseria Gonorrhea: NEGATIVE
Trichomonas: NEGATIVE

## 2019-01-15 ENCOUNTER — Other Ambulatory Visit: Payer: Self-pay | Admitting: Family Medicine

## 2019-01-15 DIAGNOSIS — Z308 Encounter for other contraceptive management: Secondary | ICD-10-CM

## 2019-01-16 NOTE — Telephone Encounter (Signed)
Left a message on pt voice mail, advised pt to contact her pharmacy for refill, pt has enough refills in her pharmacy

## 2019-01-17 ENCOUNTER — Ambulatory Visit: Payer: Self-pay

## 2019-01-17 ENCOUNTER — Other Ambulatory Visit: Payer: Self-pay | Admitting: Family Medicine

## 2019-01-17 DIAGNOSIS — Z308 Encounter for other contraceptive management: Secondary | ICD-10-CM

## 2019-01-17 NOTE — Telephone Encounter (Signed)
Please advise 

## 2019-01-17 NOTE — Telephone Encounter (Signed)
Ok to start new pack of pills given serial negative tests.  Possibly having an anovulatory cycle.

## 2019-01-17 NOTE — Telephone Encounter (Signed)
  Pt. Reports she ran out of her birth control pills for 1 month. Last period was Thanksgiving. Has been waiting for her period to start so she could start her new pack. Has done multiple pregnancy tests - all negative.Last pregnancy test was this past Sunday. Wants to know if she should go ahead and start her pills or continue to wait.Please advise pt. Answer Assessment - Initial Assessment Questions 1. TYPE: "What is the name of the birth control pill you are using?"     Generic - I can\'t remember 2. PACK TYPE: "How do you take your birth control pill?"   - 28-Day Cycle/Pack: Takes an active hormone pill days 1-21 and placebo pill on days 21-28   - 28-Day Cycle/21-Day Pack: Takes an active hormone pill days 1-21, followed by a pill-free week   - 3-Month Cycle/Pack: Takes an active pill for 3 months, followed by pill free week or placebo week   - Continuous: Takes an active hormone pill every day, continuously     28  3. INITIATION: "When did you first start taking this birth control pill?"     I've been on it awhile 4. SYMPTOM: "What is the main symptom (or question) you're concerned about?"     Have not had a period 5. ONSET: "When did the  Symptom start?"     Last month 6. VAGINAL BLEEDING: "Are you having any unusual vaginal bleeding?"   - NONE   - SPOTTING: spotting or pinkish / brownish mucous discharge; does not fill panty-liner or pad   - MILD: less than 1 pad / hour; less than patient's usual menstrual bleeding   - MODERATE: 1-2 pads / hour; small-medium blood clots (e.g., pea, grape, small coin)   - SEVERE: soaking 2 or more pads/hour for 2 or more hours; bleeding not contained by pads or  tampons     n/a 7. PAIN: "Is there any pain?" (Scale: 1-10; mild, moderate, severe).     n/a 8. MISSED/LATE PILLS: "Did you miss or take any pills late?" If yes, "When? How many pills?"     Missed 1 month 9. PREGNANCY: "Are you concerned you might be pregnant?" "When was your last menstrual   period?"     No  Protocols used: CONTRACEPTION - BIRTH CONTROL PILLS - COMBINED-A-AH

## 2019-01-20 NOTE — Telephone Encounter (Signed)
Spoke with pt state that she started her menstrual cycle this morning, states that she will start on the new birth control on Sunday.

## 2019-03-03 ENCOUNTER — Ambulatory Visit: Payer: BC Managed Care – PPO | Attending: Internal Medicine

## 2019-03-03 DIAGNOSIS — Z23 Encounter for immunization: Secondary | ICD-10-CM | POA: Insufficient documentation

## 2019-03-03 NOTE — Progress Notes (Signed)
   Covid-19 Vaccination Clinic  Name:  Delayni Streed    MRN: [id number] DOB: March 28, 1990  03/03/2019  Ms. Hulsebus was observed post Covid-19 immunization for 15 minutes without incidence. She was provided with Vaccine Information Sheet and instruction to access the V-Safe system.   Ms. Wickard was instructed to call 911 with any severe reactions post vaccine: Marland Kitchen Difficulty breathing  . Swelling of your face and throat  . A fast heartbeat  . A bad rash all over your body  . Dizziness and weakness    Immunizations Administered    Name Date Dose VIS Date Route   Pfizer COVID-19 Vaccine 03/03/2019 10:01 AM 0.3 mL 12/23/2018 Intramuscular   Manufacturer: [employer], [employer]   Lot: [id number]   NDC: [address]-9

## 2019-03-28 ENCOUNTER — Ambulatory Visit: Payer: BC Managed Care – PPO | Attending: Internal Medicine

## 2019-03-28 DIAGNOSIS — Z23 Encounter for immunization: Secondary | ICD-10-CM

## 2019-03-28 NOTE — Progress Notes (Signed)
   Covid-19 Vaccination Clinic  Name:  Karen Martin    MRN: [id number] DOB: [birth year]  03/28/2019  [name] was observed post Covid-19 immunization for 15 minutes without incident. She was provided with Vaccine Information Sheet and instruction to access the V-Safe system.   [name] was instructed to call 911 with any severe reactions post vaccine: Marland Kitchen Difficulty breathing  . Swelling of face and throat  . A fast heartbeat  . A bad rash all over body  . Dizziness and weakness   Immunizations Administered    Name Date Dose VIS Date Route   Pfizer COVID-19 Vaccine 03/28/2019  9:11 AM 0.3 mL 12/23/2018 Intramuscular   Manufacturer: [employer], [employer]   Lot: [id number]   NDC: [address]-6

## 2019-04-26 ENCOUNTER — Other Ambulatory Visit: Payer: Self-pay | Admitting: Family Medicine

## 2019-04-26 DIAGNOSIS — Z308 Encounter for other contraceptive management: Secondary | ICD-10-CM

## 2019-07-07 ENCOUNTER — Other Ambulatory Visit: Payer: Self-pay

## 2019-07-07 ENCOUNTER — Encounter: Payer: Self-pay | Admitting: Family Medicine

## 2019-07-07 ENCOUNTER — Ambulatory Visit (INDEPENDENT_AMBULATORY_CARE_PROVIDER_SITE_OTHER): Payer: BC Managed Care – PPO | Admitting: Family Medicine

## 2019-07-07 VITALS — BP 120/80 | HR 72 | Temp 97.8°F | Ht 71.0 in | Wt 208.0 lb

## 2019-07-07 DIAGNOSIS — Z13220 Encounter for screening for lipoid disorders: Secondary | ICD-10-CM

## 2019-07-07 DIAGNOSIS — Z0000 Encounter for general adult medical examination without abnormal findings: Secondary | ICD-10-CM

## 2019-07-07 DIAGNOSIS — R87610 Atypical squamous cells of undetermined significance on cytologic smear of cervix (ASC-US): Secondary | ICD-10-CM | POA: Diagnosis not present

## 2019-07-07 DIAGNOSIS — R200 Anesthesia of skin: Secondary | ICD-10-CM

## 2019-07-07 LAB — FOLATE: Folate: 13.4 ng/mL (ref >5.9)

## 2019-07-07 LAB — BASIC METABOLIC PANEL
BUN: 5 mg/dL — ABNORMAL LOW (ref 6–23)
CO2: 25 mEq/L (ref 19–32)
Calcium: 9.5 mg/dL (ref 8.4–10.5)
Chloride: 102 mEq/L (ref 96–112)
Creatinine, Ser: 0.77 mg/dL (ref 0.40–1.20)
GFR: 88.85 mL/min (ref >60.00)
Glucose, Bld: 79 mg/dL (ref 70–99)
Potassium: 4.1 mEq/L (ref 3.5–5.1)
Sodium: 139 mEq/L (ref 135–145)

## 2019-07-07 LAB — CBC WITH DIFFERENTIAL/PLATELET
Basophils Absolute: 0 10*3/uL (ref 0.0–0.1)
Basophils Relative: 0.4 % (ref 0.0–3.0)
Eosinophils Absolute: 0 10*3/uL (ref 0.0–0.7)
Eosinophils Relative: 0.4 % (ref 0.0–5.0)
HCT: 39.2 % (ref 36.0–46.0)
Hemoglobin: 13.3 g/dL (ref 12.0–15.0)
Lymphocytes Relative: 20.2 % (ref 12.0–46.0)
Lymphs Abs: 1.5 10*3/uL (ref 0.7–4.0)
MCHC: 33.8 g/dL (ref 30.0–36.0)
MCV: 94.7 fl (ref 78.0–100.0)
Monocytes Absolute: 0.7 10*3/uL (ref 0.1–1.0)
Monocytes Relative: 9.2 % (ref 3.0–12.0)
Neutro Abs: 5.1 10*3/uL (ref 1.4–7.7)
Neutrophils Relative %: 69.8 % (ref 43.0–77.0)
Platelets: 248 10*3/uL (ref 150.0–400.0)
RBC: 4.14 Mil/uL (ref 3.87–5.11)
RDW: 13.5 % (ref 11.5–15.5)
WBC: 7.3 10*3/uL (ref 4.0–10.5)

## 2019-07-07 LAB — LIPID PANEL
Cholesterol: 164 mg/dL (ref 0–200)
HDL: 82 mg/dL (ref >39.00)
LDL Cholesterol: 72 mg/dL (ref 0–99)
NonHDL: 82.11
Total CHOL/HDL Ratio: 2
Triglycerides: 51 mg/dL (ref 0.0–149.0)
VLDL: 10.2 mg/dL (ref 0.0–40.0)

## 2019-07-07 LAB — HEMOGLOBIN A1C: Hgb A1c MFr Bld: 4.9 % (ref 4.6–6.5)

## 2019-07-07 LAB — VITAMIN B12: Vitamin B-12: 248 pg/mL (ref 211–911)

## 2019-07-07 LAB — T4, FREE: Free T4: 1.15 ng/dL (ref 0.60–1.60)

## 2019-07-07 LAB — TSH: TSH: 1.22 u[IU]/mL (ref 0.35–4.50)

## 2019-07-07 NOTE — Patient Instructions (Addendum)
Preventive Care 29-29 Years Old, Female Preventive care refers to visits with your health care provider and lifestyle choices that can promote health and wellness. This includes:  A yearly physical exam. This may also be called an annual well check.  Regular dental visits and eye exams.  Immunizations.  Screening for certain conditions.  Healthy lifestyle choices, such as eating a healthy diet, getting regular exercise, not using drugs or products that contain nicotine and tobacco, and limiting alcohol use. What can I expect for my preventive care visit? Physical exam Your health care provider will check your:  Height and weight. This may be used to calculate body mass index (BMI), which tells if you are at a healthy weight.  Heart rate and blood pressure.  Skin for abnormal spots. Counseling Your health care provider may ask you questions about your:  Alcohol, tobacco, and drug use.  Emotional well-being.  Home and relationship well-being.  Sexual activity.  Eating habits.  Work and work Statistician.  Method of birth control.  Menstrual cycle.  Pregnancy history. What immunizations do I need?  Influenza (flu) vaccine  This is recommended every year. Tetanus, diphtheria, and pertussis (Tdap) vaccine  You may need a Td booster every 29 years. Varicella (chickenpox) vaccine  You may need this if you have not been vaccinated. Human papillomavirus (HPV) vaccine  If recommended by your health care provider, you may need three doses over 6 months. Measles, mumps, and rubella (MMR) vaccine  You may need at least one dose of MMR. You may also need a second dose. Meningococcal conjugate (MenACWY) vaccine  One dose is recommended if you are age 29-21 years and a first-year college student living in a residence hall, or if you have one of several medical conditions. You may also need additional booster doses. Pneumococcal conjugate (PCV13) vaccine  You may need  this if you have certain conditions and were not previously vaccinated. Pneumococcal polysaccharide (PPSV23) vaccine  You may need one or two doses if you smoke cigarettes or if you have certain conditions. Hepatitis A vaccine  You may need this if you have certain conditions or if you travel or work in places where you may be exposed to hepatitis A. Hepatitis B vaccine  You may need this if you have certain conditions or if you travel or work in places where you may be exposed to hepatitis B. Haemophilus influenzae type b (Hib) vaccine  You may need this if you have certain conditions. You may receive vaccines as individual doses or as more than one vaccine together in one shot (combination vaccines). Talk with your health care provider about the risks and benefits of combination vaccines. What tests do I need?  Blood tests  Lipid and cholesterol levels. These may be checked every 5 years starting at age 29.  Hepatitis C test.  Hepatitis B test. Screening  Diabetes screening. This is done by checking your blood sugar (glucose) after you have not eaten for a while (fasting).  Sexually transmitted disease (STD) testing.  BRCA-related cancer screening. This may be done if you have a family history of breast, ovarian, tubal, or peritoneal cancers.  Pelvic exam and Pap test. This may be done every 3 years starting at age 29. Starting at age 29, this may be done every 5 years if you have a Pap test in combination with an HPV test. Talk with your health care provider about your test results, treatment options, and if necessary, the need for more tests.  Follow these instructions at home: Eating and drinking   Eat a diet that includes fresh fruits and vegetables, whole grains, lean protein, and low-fat dairy.  Take vitamin and mineral supplements as recommended by your health care provider.  Do not drink alcohol if: ? Your health care provider tells you not to drink. ? You are  pregnant, may be pregnant, or are planning to become pregnant.  If you drink alcohol: ? Limit how much you have to 0-1 drink a day. ? Be aware of how much alcohol is in your drink. In the [place] one drink equals one 12 oz bottle of beer (355 mL), one 5 oz glass of wine (148 mL), or one 1 oz glass of hard liquor (44 mL). Lifestyle  Take daily care of your teeth and gums.  Stay active. Exercise for at least 30 minutes on 5 or more days each week.  Do not use any products that contain nicotine or tobacco, such as cigarettes, e-cigarettes, and chewing tobacco. If you need help quitting, ask your health care provider.  If you are sexually active, practice safe sex. Use a condom or other form of birth control (contraception) in order to prevent pregnancy and STIs (sexually transmitted infections). If you plan to become pregnant, see your health care provider for a preconception visit. What's next?  Visit your health care provider once a year for a well check visit.  Ask your health care provider how often you should have your eyes and teeth checked.  Stay up to date on all vaccines. This information is not intended to replace advice given to you by your health care provider. Make sure you discuss any questions you have with your health care provider. Document Revised: 09/09/2017 Document Reviewed: 09/09/2017 Elsevier Patient Education  2020 [employer].

## 2019-07-07 NOTE — Progress Notes (Signed)
Subjective:     Karen Martin is a 29 y.o. female and is here for a comprehensive physical exam. The patient reports problems - numbness in feet.  Pt notes numbness in 2nd and 3rd toes of b/l feet x 2 days.  Pt endorses wearing 3 in. heels daily at work.  Denies pain, injury, edema, or erythema.  Pap done 12/2018 with ASCUS.  Social History   Socioeconomic History  . Marital status: Single    Spouse name: Not on file  . Number of children: Not on file  . Years of education: Not on file  . Highest education level: Not on file  Occupational History  . Not on file  Tobacco Use  . Smoking status: Current Every Day Smoker    Packs/day: 1.00    Years: 8.00    Pack years: 8.00    Types: Cigarettes  . Smokeless tobacco: Never Used  Substance and Sexual Activity  . Alcohol use: Yes    Alcohol/week: 6.0 standard drinks    Types: 6 Cans of beer per week    Comment: 5 times a week  . Drug use: No  . Sexual activity: Not on file  Other Topics Concern  . Not on file  Social History Narrative  . Not on file   Social Determinants of Health   Financial Resource Strain:   . Difficulty of Paying Living Expenses:   Food Insecurity:   . Worried About Charity fundraiser in the Last Year:   . Arboriculturist in the Last Year:   Transportation Needs:   . Film/video editor (Medical):   Marland Kitchen Lack of Transportation (Non-Medical):   Physical Activity:   . Days of Exercise per Week:   . Minutes of Exercise per Session:   Stress:   . Feeling of Stress :   Social Connections:   . Frequency of Communication with Friends and Family:   . Frequency of Social Gatherings with Friends and Family:   . Attends Religious Services:   . Active Member of Clubs or Organizations:   . Attends Archivist Meetings:   Marland Kitchen Marital Status:   Intimate Partner Violence:   . Fear of Current or Ex-Partner:   . Emotionally Abused:   Marland Kitchen Physically Abused:   . Sexually Abused:    Health  Maintenance  Topic Date Due  . Hepatitis C Screening  Never done  . TETANUS/TDAP  Never done  . INFLUENZA VACCINE  08/13/2019  . PAP-Cervical Cytology Screening  12/21/2021  . PAP SMEAR-Modifier  12/21/2021  . COVID-19 Vaccine  Completed  . HIV Screening  Completed    The following portions of the patient's history were reviewed and updated as appropriate: allergies, current medications, past family history, past medical history, past social history, past surgical history and problem list.  Review of Systems Pertinent items noted in HPI and remainder of comprehensive ROS otherwise negative.   Objective:    BP 120/80 (BP Location: Right Arm, Patient Position: Sitting, Cuff Size: Large)   Pulse 72   Temp 97.8 F (36.6 C) (Temporal)   Ht 5\' 11"  (1.803 m)   Wt 208 lb (94.3 kg)   LMP 07/06/2019 (Exact Date)   SpO2 97%   BMI 29.01 kg/m  General appearance: alert, cooperative and no distress Head: Normocephalic, without obvious abnormality, atraumatic Eyes: conjunctivae/corneas clear. PERRL, EOM's intact. Fundi benign. Ears: normal TM's and external ear canals both ears Nose: Nares normal. Septum midline. Mucosa normal.  No drainage or sinus tenderness. Throat: lips, mucosa, and tongue normal; teeth and gums normal Neck: no adenopathy, no carotid bruit, no JVD, supple, symmetrical, trachea midline and thyroid not enlarged, symmetric, no tenderness/mass/nodules Lungs: clear to auscultation bilaterally Heart: regular rate and rhythm, S1, S2 normal, no murmur, click, rub or gallop Abdomen: soft, non-tender; bowel sounds normal; no masses,  no organomegaly Extremities: extremities normal, atraumatic, no cyanosis or edema  Thickening of b/l foot pads R>L.  Numbness increased with palpation of 2nd and 3rd MTP joints b/l. Pulses: 2+ and symmetric Skin: Skin color, texture, turgor normal. No rashes or lesions Lymph nodes: Cervical, supraclavicular, and axillary nodes normal. Neurologic:  Alert and oriented X 3, normal strength and tone. Normal symmetric reflexes. Normal coordination and gait    Assessment:    Healthy female exam with b/l toe numbness.     Plan:     Anticipatory guidance given including wearing seatbelts, smoke detectors in the home, increasing physical activity, increasing p.o. intake of water and vegetables. -PHQ 9 score 5 -GAD 7 score 5 -will obtain labs -pap done 12/2018 with ASC-US, HPV negative.  F/u pap in 1 yr or less advised. -given handout -next CPE in 1 yr See After Visit Summary for Counseling Recommendations    Numbness  -Given location, 2nd and 3rd toes, advised likely 2/2 nerve compression vs morton's neuroma (thickening of nerve). -advised to refrain from wearing heels for the next few wks -consider toe pad to relieve pressure on intermetatarsal plantar n. -for continued or worsening symptoms discussed referral to Podiatry.   - Plan: TSH, T4, Free, Hemoglobin A1c, Vitamin B12, Folate  Screening for cholesterol level  - Plan: Lipid panel  Atypical squamous cells of undetermined significance (ASCUS) on Papanicolaou smear of cervix  -abnormal pap (ASCUS) done 12/2018 -recommend repeating pap.   - Plan: Ambulatory referral to Obstetrics / Gynecology  F/u prn  Abbe Amsterdam, MD

## 2019-07-19 ENCOUNTER — Telehealth: Payer: Self-pay | Admitting: Family Medicine

## 2019-07-19 NOTE — Telephone Encounter (Signed)
Pt was returning Nancy's call about results. Pt needs a call back at [phone]

## 2019-07-19 NOTE — Telephone Encounter (Signed)
Returned call to patient. Result note read to patient. Patient verbalized understanding 

## 2019-08-03 ENCOUNTER — Ambulatory Visit: Payer: Self-pay | Admitting: Family Medicine

## 2019-08-09 ENCOUNTER — Telehealth: Payer: Self-pay | Admitting: *Deleted

## 2019-08-09 NOTE — Telephone Encounter (Addendum)
Pt left VM message stating that she has left several messages and her calls have not been returned. She further stated that she was referred to this office by her PCP and has appt scheduled on Friday for "Procedure 20". She wants to know what to expect during the appt and why she needs a procedure. She is feeling very anxious because she has not been given this information. Per chart review, there have been no documentation of calls from pt to this office. Also, noted that pt was advised on 12/30/18 of abnormal Pap (ASCUS, neg HPV) by Mable Paris, CMA and was recommended to have follow up Pap in 1 yr per Dr. Salomon Fick. Then pt had visit w/Dr. Salomon Fick on 07/07/19 and referral to our office was placed at that time. Confirmed with Dr. Morene Antu that pt does not require any follow up for abnormal Pap since HPV was negative. Next pap is recommended in 3 years due to her age group. Message was sent to Dr. Salomon Fick for additional information regarding referral to this office. Message was left on pt's personal voicemail letting her know I am waiting for additional information regarding her referral. She may not require the appt on Friday 7/30. I will call pt back once I have additional information.   7/29  Message received from Dr. Salomon Fick providing clarification of her referral order. She stated that pt does not require further follow up of abnormal Pap at this time. She placed the order in case pt's next pap would be abnormal. She stated that pt's appt in our office could be cancelled. I called pt and left message on her personal VM with this information as well as also sent a MyChart message with the information. Appointment has been cancelled.

## 2019-08-11 ENCOUNTER — Other Ambulatory Visit: Payer: Self-pay

## 2019-08-11 ENCOUNTER — Ambulatory Visit (INDEPENDENT_AMBULATORY_CARE_PROVIDER_SITE_OTHER): Payer: Self-pay

## 2019-08-11 ENCOUNTER — Ambulatory Visit: Payer: BC Managed Care – PPO | Admitting: Obstetrics and Gynecology

## 2019-08-11 DIAGNOSIS — E538 Deficiency of other specified B group vitamins: Secondary | ICD-10-CM

## 2019-08-11 MED ORDER — CYANOCOBALAMIN 1000 MCG/ML IJ SOLN
1000.0000 ug | Freq: Once | INTRAMUSCULAR | Status: AC
  Administered 2019-08-11: 1000 ug via INTRAMUSCULAR

## 2019-08-11 NOTE — Patient Instructions (Signed)
Health Maintenance Due  Topic Date Due  . Hepatitis C Screening  Never done    Depression screen [facility] 2/9 07/10/2019 05/03/2017  Decreased Interest 0 0  Down, Depressed, Hopeless 1 0  PHQ - 2 Score 1 0  Altered sleeping 1 2  Tired, decreased energy 1 1  Change in appetite 1 1  Feeling bad or failure about yourself  1 1  Trouble concentrating 0 0  Moving slowly or fidgety/restless 0 0  Suicidal thoughts 0 0  PHQ-9 Score 5 5  Difficult doing work/chores Somewhat difficult -

## 2019-08-11 NOTE — Progress Notes (Signed)
Per orders of Karen Saint, MD, injection of B12 given in   deltoid by [name]. Patient tolerated injection well.  Lab Results  Component Value Date   VITAMINB12 248 07/07/2019

## 2019-09-01 ENCOUNTER — Encounter: Payer: Self-pay | Admitting: Family Medicine

## 2019-09-01 ENCOUNTER — Other Ambulatory Visit: Payer: Self-pay

## 2019-09-01 ENCOUNTER — Ambulatory Visit: Payer: Self-pay

## 2019-09-01 ENCOUNTER — Ambulatory Visit: Payer: BC Managed Care – PPO | Admitting: Family Medicine

## 2019-09-01 VITALS — BP 124/80 | HR 82 | Temp 97.9°F | Wt 194.0 lb

## 2019-09-01 DIAGNOSIS — R202 Paresthesia of skin: Secondary | ICD-10-CM

## 2019-09-01 DIAGNOSIS — Z113 Encounter for screening for infections with a predominantly sexual mode of transmission: Secondary | ICD-10-CM | POA: Diagnosis not present

## 2019-09-01 DIAGNOSIS — E538 Deficiency of other specified B group vitamins: Secondary | ICD-10-CM

## 2019-09-01 DIAGNOSIS — Z308 Encounter for other contraceptive management: Secondary | ICD-10-CM | POA: Diagnosis not present

## 2019-09-01 MED ORDER — NORETHIN ACE-ETH ESTRAD-FE 1.5-30 MG-MCG PO TABS: 1.0000 | ORAL_TABLET | Freq: Every day | ORAL | 11 refills | Status: DC

## 2019-09-01 MED ORDER — CYANOCOBALAMIN 1000 MCG/ML IJ SOLN
1000.0000 ug | Freq: Once | INTRAMUSCULAR | Status: AC
  Administered 2019-09-01: 1000 ug via INTRAMUSCULAR

## 2019-09-01 MED ORDER — CYANOCOBALAMIN 1000 MCG/ML IJ SOLN: 1000.0000 ug | Freq: Once | INTRAMUSCULAR | Status: DC

## 2019-09-01 NOTE — Patient Instructions (Signed)
Vitamin B12 Deficiency Vitamin B12 deficiency occurs when the body does not have enough vitamin B12, which is an important vitamin. The body needs this vitamin:  To make red blood cells.  To make DNA. This is the genetic material inside cells.  To help the nerves work properly so they can carry messages from the brain to the body. Vitamin B12 deficiency can cause various health problems, such as a low red blood cell count (anemia) or nerve damage. What are the causes? This condition may be caused by:  Not eating enough foods that contain vitamin B12.  Not having enough stomach acid and digestive fluids to properly absorb vitamin B12 from the food that you eat.  Certain digestive system diseases that make it hard to absorb vitamin B12. These diseases include Crohn's disease, chronic pancreatitis, and cystic fibrosis.  A condition in which the body does not make enough of a protein (intrinsic factor), resulting in too few red blood cells (pernicious anemia).  Having a surgery in which part of the stomach or small intestine is removed.  Taking certain medicines that make it hard for the body to absorb vitamin B12. These medicines include: ? Heartburn medicines (antacids and proton pump inhibitors). ? Certain antibiotic medicines. ? Some medicines that are used to treat diabetes, tuberculosis, gout, or high cholesterol. What increases the risk? The following factors may make you more likely to develop a B12 deficiency:  Being older than age 28.  Eating a vegetarian or vegan diet, especially while you are pregnant.  Eating a poor diet while you are pregnant.  Taking certain medicines.  Having alcoholism. What are the signs or symptoms? In some cases, there are no symptoms of this condition. If the condition leads to anemia or nerve damage, various symptoms can occur, such as:  Weakness.  Fatigue.  Loss of appetite.  Weight loss.  Numbness or tingling in your hands and  feet.  Redness and burning of the tongue.  Confusion or memory problems.  Depression.  Sensory problems, such as color blindness, ringing in the ears, or loss of taste.  Diarrhea or constipation.  Trouble walking. If anemia is severe, symptoms can include:  Shortness of breath.  Dizziness.  Rapid heart rate (tachycardia). How is this diagnosed? This condition may be diagnosed with a blood test to measure the level of vitamin B12 in your blood. You may also have other tests, including:  A group of tests that measure certain characteristics of blood cells (complete blood count, CBC).  A blood test to measure intrinsic factor.  A procedure where a thin tube with a camera on the end is used to look into your stomach or intestines (endoscopy). Other tests may be needed to discover the cause of B12 deficiency. How is this treated? Treatment for this condition depends on the cause. This condition may be treated by:  Changing your eating and drinking habits, such as: ? Eating more foods that contain vitamin B12. ? Drinking less alcohol or no alcohol.  Getting vitamin B12 injections.  Taking vitamin B12 supplements. Your health care provider will tell you which dosage is best for you. Follow these instructions at home: Eating and drinking   Eat lots of healthy foods that contain vitamin B12, including: ? Meats and poultry. This includes beef, pork, chicken, [place], and organ meats, such as liver. ? Seafood. This includes clams, rainbow trout, salmon, tuna, and haddock. ? Eggs. ? Cereal and dairy products that are fortified. This means that vitamin B12  has been added to the food. Check the label on the package to see if the food is fortified. The items listed above may not be a complete list of recommended foods and beverages. Contact a dietitian for more information. General instructions  Get any injections that are prescribed by your health care provider.  Take  supplements only as told by your health care provider. Follow the directions carefully.  Do not drink alcohol if your health care provider tells you not to. In some cases, you may only be asked to limit alcohol use.  Keep all follow-up visits as told by your health care provider. This is important. Contact a health care provider if:  Your symptoms come back. Get help right away if you:  Develop shortness of breath.  Have a rapid heart rate.  Have chest pain.  Become dizzy or lose consciousness. Summary  Vitamin B12 deficiency occurs when the body does not have enough vitamin B12.  The main causes of vitamin B12 deficiency include dietary deficiency, digestive diseases, pernicious anemia, and having a surgery in which part of the stomach or small intestine is removed.  In some cases, there are no symptoms of this condition. If the condition leads to anemia or nerve damage, various symptoms can occur, such as weakness, shortness of breath, and numbness.  Treatment may include getting vitamin B12 injections or taking vitamin B12 supplements. Eat lots of healthy foods that contain vitamin B12. This information is not intended to replace advice given to you by your health care provider. Make sure you discuss any questions you have with your health care provider. Document Revised: 06/17/2018 Document Reviewed: 09/07/2017 Elsevier Patient Education  2020 [employer]  Paresthesia Paresthesia is an abnormal burning or prickling sensation. It is usually felt in the hands, arms, legs, or feet. However, it may occur in any part of the body. Usually, paresthesia is not painful. It may feel like:  Tingling or numbness.  Buzzing.  Itching. Paresthesia may occur without any clear cause, or it may be caused by:  Breathing too quickly (hyperventilation).  Pressure on a nerve.  An underlying medical condition.  Side effects of a medication.  Nutritional deficiencies.  Exposure to  toxic chemicals. Most people experience temporary (transient) paresthesia at some time in their lives. For some people, it may be long-lasting (chronic) because of an underlying medical condition. If you have paresthesia that lasts a long time, you may need to be evaluated by your health care provider. Follow these instructions at home: Alcohol use   Do not drink alcohol if: ? Your health care provider tells you not to drink. ? You are pregnant, may be pregnant, or are planning to become pregnant.  If you drink alcohol: ? Limit how much you use to:  0-1 drink a day for women.  0-2 drinks a day for men. ? Be aware of how much alcohol is in your drink. In the [place] one drink equals one 12 oz bottle of beer (355 mL), one 5 oz glass of wine (148 mL), or one 1 oz glass of hard liquor (44 mL). Nutrition   Eat a healthy diet. This includes: ? Eating foods that are high in fiber, such as fresh fruits and vegetables, whole grains, and beans. ? Limiting foods that are high in fat and processed sugars, such as fried or sweet foods. General instructions  Take over-the-counter and prescription medicines only as told by your health care provider.  Do not use any products that  contain nicotine or tobacco, such as cigarettes and e-cigarettes. These can keep blood from reaching damaged nerves. If you need help quitting, ask your health care provider.  If you have diabetes, work closely with your health care provider to keep your blood sugar under control.  If you have numbness in your feet: ? Check every day for signs of injury or infection. Watch for redness, warmth, and swelling. ? Wear padded socks and comfortable shoes. These help protect your feet.  Keep all follow-up visits as told by your health care provider. This is important. Contact a health care provider if you:  Have paresthesia that gets worse or does not go away.  Have a burning or prickling feeling that gets worse when you  walk.  Have pain, cramps, or dizziness.  Develop a rash. Get help right away if you:  Feel weak.  Have trouble walking or moving.  Have problems with speech, understanding, or vision.  Feel confused.  Cannot control your bladder or bowel movements.  Have numbness after an injury.  Develop new weakness in an arm or leg.  Faint. Summary  Paresthesia is an abnormal burning or prickling sensation that is usually felt in the hands, arms, legs, or feet. It may also occur in other parts of the body.  Paresthesia may occur without any clear cause, or it may be caused by breathing too quickly (hyperventilation), pressure on a nerve, an underlying medical condition, side effects of a medication, nutritional deficiencies, or exposure to toxic chemicals.  If you have paresthesia that lasts a long time, you may need to be evaluated by your health care provider. This information is not intended to replace advice given to you by your health care provider. Make sure you discuss any questions you have with your health care provider. Document Revised: 01/24/2018 Document Reviewed: 01/07/2017 Elsevier Patient Education  2020 [employer].  Managing Loss, Adult People experience loss in many different ways throughout their lives. Events such as moving, changing jobs, and losing friends can create a sense of loss. The loss may be as serious as a major health change, divorce, death of a pet, or death of a loved one. All of these types of loss are likely to create a physical and emotional reaction known as grief. Grief is the result of a major change or an absence of something or someone that you count on. Grief is a normal reaction to loss. A variety of factors can affect your grieving experience, including:  The nature of your loss.  Your relationship to what or whom you lost.  Your understanding of grief and how to manage it.  Your support system. How to manage lifestyle changes Keep to  your normal routine as much as possible.  If you have trouble focusing or doing normal activities, it is acceptable to take some time away from your normal routine.  Spend time with friends and loved ones.  Eat a healthy diet, get plenty of sleep, and rest when you feel tired. How to recognize changes  The way that you deal with your grief will affect your ability to function as you normally do. When grieving, you may experience these changes:  Numbness, shock, sadness, anxiety, anger, denial, and guilt.  Thoughts about death.  Unexpected crying.  A physical sensation of emptiness in your stomach.  Problems sleeping and eating.  Tiredness (fatigue).  Loss of interest in normal activities.  Dreaming about or imagining seeing the person who died.  A need to  remember what or whom you lost.  Difficulty thinking about anything other than your loss for a period of time.  Relief. If you have been expecting the loss for a while, you may feel a sense of relief when it happens. Follow these instructions at home:  Activity Express your feelings in healthy ways, such as:  Talking with others about your loss. It may be helpful to find others who have had a similar loss, such as a support group.  Writing down your feelings in a journal.  Doing physical activities to release stress and emotional energy.  Doing creative activities like painting, sculpting, or playing or listening to music.  Practicing resilience. This is the ability to recover and adjust after facing challenges. Reading some resources that encourage resilience may help you to learn ways to practice those behaviors. General instructions  Be patient with yourself and others. Allow the grieving process to happen, and remember that grieving takes time. ? It is likely that you may never feel completely done with some grief. You may find a way to move on while still cherishing memories and feelings about your  loss. ? Accepting your loss is a process. It can take months or longer to adjust.  Keep all follow-up visits as told by your health care provider. This is important. Where to find support To get support for managing loss:  Ask your health care provider for help and recommendations, such as grief counseling or therapy.  Think about joining a support group for people who are managing a loss. Where to find more information You can find more information about managing loss from:  American Society of Clinical Oncology: www.cancer.net  American Psychological Association: [employer] Contact a health care provider if:  Your grief is extreme and keeps getting worse.  You have ongoing grief that does not improve.  Your body shows symptoms of grief, such as illness.  You feel depressed, anxious, or lonely. Get help right away if:  You have thoughts about hurting yourself or others. If you ever feel like you may hurt yourself or others, or have thoughts about taking your own life, get help right away. You can go to your nearest emergency department or call:  Your local emergency services (911 in the [place]  A suicide crisis helpline, such as the National Suicide Prevention Lifeline at 2621700185. This is open 24 hours a day. Summary  Grief is the result of a major change or an absence of someone or something that you count on. Grief is a normal reaction to loss.  The depth of grief and the period of recovery depend on the type of loss and your ability to adjust to the change and process your feelings.  Processing grief requires patience and a willingness to accept your feelings and talk about your loss with people who are supportive.  It is important to find resources that work for you and to realize that people experience grief differently. There is not one grieving process that works for everyone in the same way.  Be aware that when grief becomes extreme, it can lead to more  severe issues like isolation, depression, anxiety, or suicidal thoughts. Talk with your health care provider if you have any of these issues. This information is not intended to replace advice given to you by your health care provider. Make sure you discuss any questions you have with your health care provider. Document Revised: 03/04/2018 Document Reviewed: 05/14/2016 Elsevier Patient Education  2020 [employer].

## 2019-09-01 NOTE — Progress Notes (Signed)
Subjective:    Patient ID: Karen Martin, female    DOB: 07/08/1990, 29 y.o.   MRN: [id number]  No chief complaint on file.   HPI Patient was seen today for f/u.  Pt requesting STI testing.  Pt endorses recent break up with her boyfriend of 6 yrs.  States spending time with friends to keep her mind off of it.  Planning to see her counselor.  Pt on Aurovela FE OCPs but notes recent unprotected sex.  Requesting hCG testing.  LMP was 08/11/19.     Pt also here for second B12 injection.  Noted some improvement in numbness in foot after receiving first injection.  States the sensation returned a few weeks after the injection.  Patient no longer wearing heels to work.  Following also taking OTC B12.  No past medical history on file.  No Known Allergies  ROS General: Denies fever, chills, night sweats, changes in weight, changes in appetite HEENT: Denies headaches, ear pain, changes in vision, rhinorrhea, sore throat CV: Denies CP, palpitations, SOB, orthopnea Pulm: Denies SOB, cough, wheezing GI: Denies abdominal pain, nausea, vomiting, diarrhea, constipation GU: Denies dysuria, hematuria, frequency, vaginal discharge Msk: Denies muscle cramps, joint pains Neuro: Denies weakness  + numbness, tingling in foot Skin: Denies rashes, bruising Psych: Denies depression, anxiety, hallucinations     Objective:    Blood pressure 124/80, pulse 82, temperature 97.9 F (36.6 C), temperature source Oral, weight 194 lb (88 kg), last menstrual period 08/11/2019, SpO2 98 %.   Gen. Pleasant, well-nourished, in no distress, normal affect   HEENT: [place]/AT, face symmetric, no scleral icterus, PERRLA, EOMI, nares patent without drainage Lungs: no accessory muscle use Cardiovascular: RRR, no peripheral edema Musculoskeletal: No deformities, no cyanosis or clubbing, normal tone Neuro:  A&Ox3, CN II-XII intact, normal gait Skin:  Warm, no lesions/ rash   Wt Readings from Last 3 Encounters:  09/01/19  194 lb (88 kg)  07/07/19 208 lb (94.3 kg)  12/22/18 200 lb 12.8 oz (91.1 kg)    Lab Results  Component Value Date   WBC 7.3 07/07/2019   HGB 13.3 07/07/2019   HCT 39.2 07/07/2019   PLT 248.0 07/07/2019   GLUCOSE 79 07/07/2019   CHOL 164 07/07/2019   TRIG 51.0 07/07/2019   HDL 82.00 07/07/2019   LDLCALC 72 07/07/2019   NA 139 07/07/2019   K 4.1 07/07/2019   CL 102 07/07/2019   CREATININE 0.77 07/07/2019   BUN 5 (L) 07/07/2019   CO2 25 07/07/2019   TSH 1.22 07/07/2019   HGBA1C 4.9 07/07/2019    Assessment/Plan:  Routine screening for STI (sexually transmitted infection)  -Given recent break-up, patient advised to proceed with scheduling appointment with counselor - Plan: HIV antibody (with reflex), RPR, C. trachomatis/N. gonorrhoeae RNA, HCG, Quant, Pregnancy, C. trachomatis/N. gonorrhoeae RNA, RPR, HIV antibody (with reflex)  Paresthesia -Discussed possible causes including vitamin B12 deficiency, Morton's neuroma -For continued symptoms discussed follow-up with podiatry - Plan: cyanocobalamin ((VITAMIN B-12)) injection 1,000 mcg  Vitamin B12 deficiency  - Plan: cyanocobalamin ((VITAMIN B-12)) injection 1,000 mcg  Encounter for other contraceptive management -POC hCG negative.  Discussed options as likely too soon for test to turn positive given expected menses 8/30.  Patient wishes to proceed with quant hCG.  Also advised to check home hCG closer to expected menses. - Plan: norethindrone-ethinyl estradiol-iron (AUROVELA FE 1.5/30) 1.5-30 MG-MCG tablet  F/u prn  Abbe Amsterdam, MD

## 2019-09-06 LAB — RPR: RPR Ser Ql: NONREACTIVE

## 2019-09-06 LAB — C. TRACHOMATIS/N. GONORRHOEAE RNA
C. trachomatis RNA, TMA: NOT DETECTED
N. gonorrhoeae RNA, TMA: NOT DETECTED

## 2019-09-06 LAB — HCG, QUANTITATIVE, PREGNANCY: HCG, Total, QN: <3 m[IU]/mL

## 2019-09-06 LAB — HIV ANTIBODY (ROUTINE TESTING W REFLEX): HIV 1&2 Ab, 4th Generation: NONREACTIVE

## 2020-03-01 DIAGNOSIS — H5213 Myopia, bilateral: Secondary | ICD-10-CM | POA: Diagnosis not present

## 2020-04-05 ENCOUNTER — Encounter: Payer: Self-pay | Admitting: Family Medicine

## 2020-04-05 ENCOUNTER — Other Ambulatory Visit: Payer: Self-pay

## 2020-04-05 ENCOUNTER — Ambulatory Visit: Payer: BC Managed Care – PPO | Admitting: Family Medicine

## 2020-04-05 VITALS — BP 145/90 | HR 68 | Temp 98.3°F | Wt 205.4 lb

## 2020-04-05 DIAGNOSIS — U099 Post covid-19 condition, unspecified: Secondary | ICD-10-CM

## 2020-04-05 DIAGNOSIS — R5382 Chronic fatigue, unspecified: Secondary | ICD-10-CM

## 2020-04-05 DIAGNOSIS — L819 Disorder of pigmentation, unspecified: Secondary | ICD-10-CM | POA: Diagnosis not present

## 2020-04-05 DIAGNOSIS — R030 Elevated blood-pressure reading, without diagnosis of hypertension: Secondary | ICD-10-CM | POA: Diagnosis not present

## 2020-04-05 DIAGNOSIS — G9332 Myalgic encephalomyelitis/chronic fatigue syndrome: Secondary | ICD-10-CM

## 2020-04-05 DIAGNOSIS — E538 Deficiency of other specified B group vitamins: Secondary | ICD-10-CM

## 2020-04-05 LAB — CBC WITH DIFFERENTIAL/PLATELET
Basophils Absolute: 0 10*3/uL (ref 0.0–0.1)
Basophils Relative: 0.6 % (ref 0.0–3.0)
Eosinophils Absolute: 0.1 10*3/uL (ref 0.0–0.7)
Eosinophils Relative: 0.9 % (ref 0.0–5.0)
HCT: 39.7 % (ref 36.0–46.0)
Hemoglobin: 13.6 g/dL (ref 12.0–15.0)
Lymphocytes Relative: 19.8 % (ref 12.0–46.0)
Lymphs Abs: 1.6 10*3/uL (ref 0.7–4.0)
MCHC: 34.3 g/dL (ref 30.0–36.0)
MCV: 94.1 fl (ref 78.0–100.0)
Monocytes Absolute: 0.7 10*3/uL (ref 0.1–1.0)
Monocytes Relative: 8.5 % (ref 3.0–12.0)
Neutro Abs: 5.8 10*3/uL (ref 1.4–7.7)
Neutrophils Relative %: 70.2 % (ref 43.0–77.0)
Platelets: 269 10*3/uL (ref 150.0–400.0)
RBC: 4.22 Mil/uL (ref 3.87–5.11)
RDW: 13.7 % (ref 11.5–15.5)
WBC: 8.2 10*3/uL (ref 4.0–10.5)

## 2020-04-05 LAB — BASIC METABOLIC PANEL
BUN: 7 mg/dL (ref 6–23)
CO2: 25 mEq/L (ref 19–32)
Calcium: 9.8 mg/dL (ref 8.4–10.5)
Chloride: 100 mEq/L (ref 96–112)
Creatinine, Ser: 0.7 mg/dL (ref 0.40–1.20)
GFR: 116.9 mL/min (ref >60.00)
Glucose, Bld: 88 mg/dL (ref 70–99)
Potassium: 4.1 mEq/L (ref 3.5–5.1)
Sodium: 138 mEq/L (ref 135–145)

## 2020-04-05 LAB — VITAMIN D 25 HYDROXY (VIT D DEFICIENCY, FRACTURES): VITD: 27.81 ng/mL — ABNORMAL LOW (ref 30.00–100.00)

## 2020-04-05 LAB — VITAMIN B12: Vitamin B-12: 722 pg/mL (ref 211–911)

## 2020-04-05 LAB — T4, FREE: Free T4: 1.08 ng/dL (ref 0.60–1.60)

## 2020-04-05 LAB — MAGNESIUM: Magnesium: 1.8 mg/dL (ref 1.5–2.5)

## 2020-04-05 LAB — TSH: TSH: 1.41 u[IU]/mL (ref 0.35–4.50)

## 2020-04-05 MED ORDER — CYANOCOBALAMIN 1000 MCG/ML IJ SOLN
1000.0000 ug | Freq: Once | INTRAMUSCULAR | Status: AC
  Administered 2020-04-05: 1000 ug via INTRAMUSCULAR

## 2020-04-05 NOTE — Patient Instructions (Addendum)
You can try [place] shampoo on her shoulders and upper back as discussed.     Fatigue If you have fatigue, you feel tired all the time and have a lack of energy or a lack of motivation. Fatigue may make it difficult to start or complete tasks because of exhaustion. In general, occasional or mild fatigue is often a normal response to activity or life. However, long-lasting (chronic) or extreme fatigue may be a symptom of a medical condition. Follow these instructions at home: General instructions  Watch your fatigue for any changes.  Go to bed and get up at the same time every day.  Avoid fatigue by pacing yourself during the day and getting enough sleep at night.  Maintain a healthy weight. Medicines  Take over-the-counter and prescription medicines only as told by your health care provider.  Take a multivitamin, if told by your health care provider.  Do not use herbal or dietary supplements unless they are approved by your health care provider. Activity  Exercise regularly, as told by your health care provider.  Use or practice techniques to help you relax, such as yoga, tai chi, meditation, or massage therapy.   Eating and drinking  Avoid heavy meals in the evening.  Eat a well-balanced diet, which includes lean proteins, whole grains, plenty of fruits and vegetables, and low-fat dairy products.  Avoid consuming too much caffeine.  Avoid the use of alcohol.  Drink enough fluid to keep your urine pale yellow.   Lifestyle  Change situations that cause you stress. Try to keep your work and personal schedule in balance.  Do not use any products that contain nicotine or tobacco, such as cigarettes and e-cigarettes. If you need help quitting, ask your health care provider.  Do not use drugs. Contact a health care provider if:  Your fatigue does not get better.  You have a fever.  You suddenly lose or gain weight.  You have headaches.  You have trouble falling  asleep or sleeping through the night.  You feel angry, guilty, anxious, or sad.  You are unable to have a bowel movement (constipation).  Your skin is dry.  You have swelling in your legs or another part of your body. Get help right away if:  You feel confused.  Your vision is blurry.  You feel faint or you pass out.  You have a severe headache.  You have severe pain in your abdomen, your back, or the area between your waist and hips (pelvis).  You have chest pain, shortness of breath, or an irregular or fast heartbeat.  You are unable to urinate, or you urinate less than normal.  You have abnormal bleeding, such as bleeding from the rectum, vagina, nose, lungs, or nipples.  You vomit blood.  You have thoughts about hurting yourself or others. If you ever feel like you may hurt yourself or others, or have thoughts about taking your own life, get help right away. You can go to your nearest emergency department or call:  Your local emergency services (911 in the [place]  A suicide crisis helpline, such as the [place] at 904 310 3949. This is open 24 hours a day. Summary  If you have fatigue, you feel tired all the time and have a lack of energy or a lack of motivation.  Fatigue may make it difficult to start or complete tasks because of exhaustion.  Long-lasting (chronic) or extreme fatigue may be a symptom of a medical condition.  Exercise regularly, as told by your health care provider.  Change situations that cause you stress. Try to keep your work and personal schedule in balance. This information is not intended to replace advice given to you by your health care provider. Make sure you discuss any questions you have with your health care provider. Document Revised: 07/20/2018 Document Reviewed: 09/23/2016 Elsevier Patient Education  2021 [place].  Preventing Hypertension Hypertension, also called high blood pressure, is when  the force of blood pumping through the arteries is too strong. Arteries are blood vessels that carry blood from the heart throughout the body. Often, hypertension does not cause symptoms until blood pressure is very high. It is important to have your blood pressure checked regularly. Diet and lifestyle changes can help you prevent hypertension, and they may make you feel better overall and improve your quality of life. If you already have hypertension, you may control it with diet and lifestyle changes, as well as with medicine. How can this condition affect me? Over time, hypertension can damage the arteries and decrease blood flow to important parts of the body, including the brain, heart, and kidneys. By keeping your blood pressure in a healthy range, you can help prevent complications like heart attack, heart failure, stroke, kidney failure, and vascular dementia. What can increase my risk?  Being an older adult. Older people are more often affected.  Having family members who have had high blood pressure.  Being obese.  Being female. Males are more likely to have high blood pressure.  Drinking too much alcohol or caffeine.  Smoking or using illegal drugs.  Taking certain medicines, such as antidepressants, decongestants, birth control pills, and NSAIDs, such as ibuprofen.  Having thyroid problems.  Having certain tumors. What actions can I take to prevent or manage this condition? Work with your health care provider to make a hypertension prevention plan that works for you. Follow your plan and keep all follow-up visits as told by your health care provider. Diet changes Maintain a healthy diet. This includes:  Eating less salt (sodium). Ask your health care provider how much sodium is safe for you to have. The general recommendation is to have less than 1 tsp (2,300 mg) of sodium a day. ? Do not add salt to your food. ? Choose low-sodium options when grocery shopping and eating  out.  Limiting fats in your diet. You can do this by eating low-fat or fat-free dairy products and by eating less red meat.  Eating more fruits, vegetables, and whole grains. Make a goal to eat: ? 1-2 cups of fresh fruits and vegetables each day. ? 3-4 servings of whole grains each day.  Avoiding foods and beverages that have added sugars.  Eating fish that contain healthy fats (omega-3 fatty acids), such as mackerel or salmon. If you need help putting together a healthy eating plan, try the DASH diet. This diet is high in fruits, vegetables, and whole grains. It is low in sodium, red meat, and added sugars. DASH stands for Dietary Approaches to Stop Hypertension.   Lifestyle changes Lose weight if you are overweight. Losing just 3?5% of your body weight can help prevent or control hypertension. For example, if your present weight is 200 lb (91 kg), a loss of 3-5% of your weight means losing 6-10 lb (2.7-4.5 kg). Ask your health care provider to help you with a diet and exercise plan to safely lose weight. Other recommendations usually include:  Get enough exercise. Do at least  150 minutes of moderate-intensity exercise each week. You could do this in short exercise sessions several times a day, or you could do longer exercise sessions a few times a week. For example, you could take a brisk 10-minute walk or bike ride, 3 times a day, for 5 days a week.  Find ways to reduce stress, such as exercising, meditating, listening to music, or taking a yoga class. If you need help reducing stress, ask your health care provider.  Do not use any products that contain nicotine or tobacco, such as cigarettes, e-cigarettes, and chewing tobacco. If you need help quitting, ask your health care provider. Chemicals in tobacco and nicotine products raise your blood pressure each time you use them. If you need help quitting, ask your health care provider.  Learn how to check your blood pressure at home. Make sure  that you know your personal target blood pressure, as told by your health care provider.  Try to sleep 7-9 hours per night.   Alcohol use  Do not drink alcohol if: ? Your health care provider tells you not to drink. ? You are pregnant, may be pregnant, or are planning to become pregnant.  If you drink alcohol: ? Limit how much you use to:  0-1 drink a day for women.  0-2 drinks a day for men. ? Be aware of how much alcohol is in your drink. In the [place] one drink equals one 12 oz bottle of beer (355 mL), one 5 oz glass of wine (148 mL), or one 1 oz glass of hard liquor (44 mL). Medicines In addition to diet and lifestyle changes, your health care provider may recommend medicines to help lower your blood pressure. In general:  You may need to try a few different medicines to find what works best for you.  You may need to take more than one medicine.  Take over-the-counter and prescription medicines only as told by your health care provider. Questions to ask your health care provider  What is my blood pressure goal?  How can I lower my risk for high blood pressure?  How should I monitor my blood pressure at home? Where to find support Your health care provider can help you prevent hypertension and help you keep your blood pressure at a healthy level. Your local hospital or your community may also provide support services and prevention programs. The American Heart Association offers an online support network at supportnetwork.heart.org Where to find more information Learn more about hypertension from:  [place], Lung, and [place]: https://wilson-eaton.com/  Centers for Disease Control and Prevention: http://www.wolf.info/  American Academy of Family Physicians: familydoctor.org Learn more about the DASH diet from:  [place], Lung, and [place]: https://wilson-eaton.com/ Contact a health care provider if:  You think you are having a reaction to medicines you have  taken.  You have recurrent headaches or feel dizzy.  You have swelling in your ankles.  You have trouble with your vision. Get help right away if:  You have sudden, severe chest, back, or abdominal pain or discomfort.  You have shortness of breath.  You have a sudden, severe headache. These symptoms may represent a serious problem that is an emergency. Do not wait to see if the symptoms will go away. Get medical help right away. Call your local emergency services (911 in the [place] Do not drive yourself to the hospital.  Summary  Hypertension often does not cause any symptoms until blood pressure is very high. It  is important to get your blood pressure checked regularly.  Diet and lifestyle changes are important steps in preventing hypertension.  By keeping your blood pressure in a healthy range, you may prevent complications like heart attack, heart failure, stroke, and kidney failure.  Work with your health care provider to make a hypertension prevention plan that works for you. This information is not intended to replace advice given to you by your health care provider. Make sure you discuss any questions you have with your health care provider. Document Revised: 11/29/2018 Document Reviewed: 11/29/2018 Elsevier Patient Education  2021 [place].  How to Take Your Blood Pressure Blood pressure measures how strongly your blood is pressing against the walls of your arteries. Arteries are blood vessels that carry blood from your heart throughout your body. You can take your blood pressure at home with a machine. You may need to check your blood pressure at home:  To check if you have high blood pressure (hypertension).  To check your blood pressure over time.  To make sure your blood pressure medicine is working. Supplies needed:  Blood pressure machine, or monitor.  Dining room chair to sit in.  Table or desk.  Small notebook.  Pencil or pen. How to prepare Avoid  these things for 30 minutes before checking your blood pressure:  Having drinks with caffeine in them, such as coffee or tea.  Drinking alcohol.  Eating.  Smoking.  Exercising. Do these things five minutes before checking your blood pressure:  Go to the bathroom and pee (urinate).  Sit in a dining chair. Do not sit in a soft couch or an armchair.  Be quiet. Do not talk. How to take your blood pressure Follow the instructions that came with your machine. If you have a digital blood pressure monitor, these may be the instructions: 1. Sit up straight. 2. Place your feet on the floor. Do not cross your ankles or legs. 3. Rest your left arm at the level of your heart. You may rest it on a table, desk, or chair. 4. Pull up your shirt sleeve. 5. Wrap the blood pressure cuff around the upper part of your left arm. The cuff should be 1 inch (2.5 cm) above your elbow. It is best to wrap the cuff around bare skin. 6. Fit the cuff snugly around your arm. You should be able to place only one finger between the cuff and your arm. 7. Place the cord so that it rests in the bend of your elbow. 8. Press the power button. 9. Sit quietly while the cuff fills with air and loses air. 10. Write down the numbers on the screen. 11. Wait 2-3 minutes and then repeat steps 1-10.   What do the numbers mean? Two numbers make up your blood pressure. The first number is called systolic pressure. The second is called diastolic pressure. An example of a blood pressure reading is "120 over 80" (or 120/80). If you are an adult and do not have a medical condition, use this guide to find out if your blood pressure is normal: Normal  First number: below 120.  Second number: below 80. Elevated  First number: 120-129.  Second number: below 80. Hypertension stage 1  First number: 130-139.  Second number: 80-89. Hypertension stage 2  First number: 140 or above.  Second number: 46 or above. Your blood  pressure is above normal even if only the top or bottom number is above normal. Follow these instructions at home:  Check your blood pressure as often as your doctor tells you to.  Check your blood pressure at the same time every day.  Take your monitor to your next doctor's appointment. Your doctor will: ? Make sure you are using it correctly. ? Make sure it is working right.  Make sure you understand what your blood pressure numbers should be.  Tell your doctor if your medicine is causing side effects.  Keep all follow-up visits as told by your doctor. This is important. General tips:  You will need a blood pressure machine, or monitor. Your doctor can suggest a monitor. You can buy one at a drugstore or online. When choosing one: ? Choose one with an arm cuff. ? Choose one that wraps around your upper arm. Only one finger should fit between your arm and the cuff. ? Do not choose one that measures your blood pressure from your wrist or finger. Where to find more information American Heart Association: www.heart.org Contact a doctor if:  Your blood pressure keeps being high. Get help right away if:  Your first blood pressure number is higher than 180.  Your second blood pressure number is higher than 120. Summary  Check your blood pressure at the same time every day.  Avoid caffeine, alcohol, smoking, and exercise for 30 minutes before checking your blood pressure.  Make sure you understand what your blood pressure numbers should be. This information is not intended to replace advice given to you by your health care provider. Make sure you discuss any questions you have with your health care provider. Document Revised: 12/23/2018 Document Reviewed: 12/23/2018 Elsevier Patient Education  2021 [place].  Vitamin B12 Deficiency Vitamin B12 deficiency means that your body does not have enough vitamin B12. The body needs this vitamin:  To make red blood cells.  To  make genes (DNA).  To help the nerves work. If you do not have enough vitamin B12 in your body, you can have health problems. What are the causes?  Not eating enough foods that contain vitamin B12.  Not being able to absorb vitamin B12 from the food that you eat.  Certain digestive system diseases.  A condition in which the body does not make enough of a certain protein, which results in too few red blood cells (pernicious anemia).  Having a surgery in which part of the stomach or small intestine is removed.  Taking medicines that make it hard for the body to absorb vitamin B12. These medicines include: ? Heartburn medicines. ? Some antibiotic medicines. ? Other medicines that are used to treat certain conditions. What increases the risk?  Being older than age 72.  Eating a vegetarian or vegan diet, especially while you are pregnant.  Eating a poor diet while you are pregnant.  Taking certain medicines.  Having alcoholism. What are the signs or symptoms? In some cases, there are no symptoms. If the condition leads to too few blood cells or nerve damage, symptoms can occur, such as:  Feeling weak.  Feeling tired (fatigued).  Not being hungry.  Weight loss.  A loss of feeling (numbness) or tingling in your hands and feet.  Redness and burning of the tongue.  Being mixed up (confused) or having memory problems.  Sadness (depression).  Problems with your senses. This can include color blindness, ringing in the ears, or loss of taste.  Watery poop (diarrhea) or trouble pooping (constipation).  Trouble walking. If anemia is very bad, symptoms can include:  Being short  of breath.  Being dizzy.  Having a very fast heartbeat. How is this treated?  Changing the way you eat and drink, such as: ? Eating more foods that contain vitamin B12. ? Drinking little or no alcohol.  Getting vitamin B12 shots.  Taking vitamin B12 supplements. Your doctor will tell you  the dose that is best for you. Follow these instructions at home: Eating and drinking  Eat lots of healthy foods that contain vitamin B12. These include: ? Meats and poultry, such as beef, pork, chicken, [place], and organ meats, such as liver. ? Seafood, such as clams, rainbow trout, salmon, tuna, and haddock. ? Eggs. ? Cereal and dairy products that have vitamin B12 added to them. Check the label. The items listed above may not be a complete list of what you can eat and drink. Contact a dietitian for more options.   General instructions  Get any shots as told by your doctor.  Take supplements only as told by your doctor.  Do not drink alcohol if your doctor tells you not to. In some cases, you may only be asked to limit alcohol use.  Keep all follow-up visits as told by your doctor. This is important. Contact a doctor if:  Your symptoms come back. Get help right away if:  You have trouble breathing.  You have a very fast heartbeat.  You have chest pain.  You get dizzy.  You pass out. Summary  Vitamin B12 deficiency means that your body is not getting enough vitamin B12.  In some cases, there are no symptoms of this condition.  Treatment may include making a change in the way you eat and drink, getting vitamin B12 shots, or taking supplements.  Eat lots of healthy foods that contain vitamin B12. This information is not intended to replace advice given to you by your health care provider. Make sure you discuss any questions you have with your health care provider. Document Revised: 09/07/2017 Document Reviewed: 09/07/2017 Elsevier Patient Education  2021 [place] is a common fungal infection of the skin. It causes a rash that appears as light or dark patches on the skin. The rash most often occurs on the chest, back, neck, or upper arms. This condition is more common during warm weather. Other than affecting how your skin  looks, tinea versicolor usually does not cause other problems. In most cases, the infection goes away in a few weeks with treatment. It may take a few months for the patches on your skin to return to your usual skin color. What are the causes? This condition occurs when a type of fungus that is normally present on the skin starts to overgrow. This fungus is a kind of yeast. The exact cause of the overgrowth is not known. This condition cannot be passed from one person to another (it is not contagious). What increases the risk? This condition is more likely to develop when certain factors are present, such as:  Heat and humidity.  Sweating too much.  Hormone changes.  Oily skin.  A weak disease-fighting system (immunesystem). What are the signs or symptoms? Symptoms of this condition include:  A rash of light or dark patches on your skin. The rash may have: ? Patches of tan or pink spots (on light skin). ? Patches of white or brown spots (on dark skin). ? Patches of skin that do not tan. ? Well-marked edges. ? Scales on the discolored areas.  Mild itching. How  is this diagnosed? A health care provider can usually diagnose this condition by looking at your skin. During the exam, he or she may use ultraviolet (UV) light to see how much of your skin has been affected. In some cases, a skin sample may be taken by scraping the rash. This sample will be viewed under a microscope to check for yeast overgrowth. How is this treated? Treatment for this condition may include:  Dandruff shampoo that is applied to the affected skin during showers or bathing.  Over-the-counter medicated skin cream, lotion, or soaps.  Prescription antifungal medicine in the form of skin cream or pills.  Medicine to help reduce itching. Follow these instructions at home:  Take over-the-counter and prescription medicines only as told by your health care provider.  Apply dandruff shampoo to the affected area  if your health care provider told you to do that. You may be instructed to scrub the affected skin for several minutes each day.  Do not scratch the affected area of skin.  Avoid hot and humid conditions.  Do not use tanning booths.  Try to avoid sweating a lot. Contact a health care provider if:  Your symptoms get worse.  You have a fever.  You have redness, swelling, or pain at the site of your rash.  You have fluid or blood coming from your rash.  Your rash feels warm to the touch.  You have pus or a bad smell coming from your rash.  Your rash returns (recurs) after treatment. Summary  Tinea versicolor is a common fungal infection of the skin. It causes a rash that appears as light or dark patches on the skin.  The rash most often occurs on the chest, back, neck, or upper arms.  A health care provider can usually diagnose this condition by looking at your skin.  Treatment may include applying shampoo to the skin and taking or applying medicines. This information is not intended to replace advice given to you by your health care provider. Make sure you discuss any questions you have with your health care provider. Document Revised: 10/25/2019 Document Reviewed: 10/25/2019 Elsevier Patient Education  2021 [employer].

## 2020-04-05 NOTE — Progress Notes (Signed)
Subjective:    Patient ID: Karen Martin, female    DOB: 06-Aug-1990, 30 y.o.   MRN: [id number]  No chief complaint on file.   HPI Patient was seen today for ongoing concern.  Patient endorses continued fatigue since having COVID-19 infection in January 2022.  Exercising regularly.  Taking a MVI, B12, and another supplement, but still feeling tired. Denies SOB, CP, jt pain.  Notes some increased stress at work causing anxiety/decreased sleep.  Patient inquires about dermatology referral on upper shoulders that she saw for 2/2 laying out in the sun.  Patient states has remained now that her tan is gone.  The areas are nonpruritic.  No past medical history on file.  No Known Allergies  ROS General: Denies fever, chills, night sweats, changes in weight, changes in appetite +fatigue HEENT: Denies headaches, ear pain, changes in vision, rhinorrhea, sore throat CV: Denies CP, palpitations, SOB, orthopnea Pulm: Denies SOB, cough, wheezing GI: Denies abdominal pain, nausea, vomiting, diarrhea, constipation GU: Denies dysuria, hematuria, frequency, vaginal discharge Msk: Denies muscle cramps, joint pains Neuro: Denies weakness, numbness, tingling Skin: Denies bruising +rash Psych: Denies depression, anxiety, hallucinations     Objective:    Blood pressure (!) 145/90, pulse 68, temperature 98.3 F (36.8 C), temperature source Oral, weight 205 lb 6.4 oz (93.2 kg), SpO2 99 %.  Gen. Pleasant, well-nourished, in no distress, normal affect   HEENT: [place]/AT, face symmetric, conjunctiva clear, no scleral icterus, PERRLA, EOMI, nares patent without drainage Lungs: no accessory muscle use, CTAB, no wheezes or rales Cardiovascular: RRR, no m/r/g, no peripheral edema Abdomen: BS present, soft, NT/ND, no hepatosplenomegaly. Musculoskeletal: No deformities, no cyanosis or clubbing, normal tone Neuro:  A&Ox3, CN II-XII intact, normal gait Skin:  Warm, dry, intact bilateral shoulders and posterior  upper back with circular, flat, hypopigmented areas of skin.   Wt Readings from Last 3 Encounters:  09/01/19 194 lb (88 kg)  07/07/19 208 lb (94.3 kg)  12/22/18 200 lb 12.8 oz (91.1 kg)    Lab Results  Component Value Date   WBC 7.3 07/07/2019   HGB 13.3 07/07/2019   HCT 39.2 07/07/2019   PLT 248.0 07/07/2019   GLUCOSE 79 07/07/2019   CHOL 164 07/07/2019   TRIG 51.0 07/07/2019   HDL 82.00 07/07/2019   LDLCALC 72 07/07/2019   NA 139 07/07/2019   K 4.1 07/07/2019   CL 102 07/07/2019   CREATININE 0.77 07/07/2019   BUN 5 (L) 07/07/2019   CO2 25 07/07/2019   TSH 1.22 07/07/2019   HGBA1C 4.9 07/07/2019    Assessment/Plan:  Post-COVID chronic fatigue -Discussed other possible causes including vitamin deficiencies.  Will obtain labs -Discussed continuing multivitamin -Also consider magnesium and vitamin C supplement -Discussed possible duration of symptoms based on etiology - Plan: CBC with Differential/Platelet, Basic metabolic panel, TSH, T4, Free, Vitamin B12, Vitamin D, 25-hydroxy, Magnesium, cyanocobalamin ((VITAMIN B-12)) injection 1,000 mcg  Vitamin B12 deficiency -We will obtain B12 level prior to receiving injection today - Plan: Vitamin B12, cyanocobalamin ((VITAMIN B-12)) injection 1,000 mcg  Hypopigmentation of skin -Discussed possible causes of hypopigmentation including tinea versicolor, vitiligo -Discussed trying [place] Blue shampoo -Given handout - Plan: Ambulatory referral to Dermatology  Elevated blood pressure reading -BP elevated initially and on repeat -Discussed lifestyle modifications -Patient to check BP at home and keep a log to bring to the clinic -We will have follow-up in 2-4 weeks for BP recheck  - Plan: Basic metabolic panel  F/u in 2-4 weeks for BP  recheck  Abbe Amsterdam, MD

## 2020-04-10 ENCOUNTER — Other Ambulatory Visit: Payer: Self-pay | Admitting: Family Medicine

## 2020-04-10 DIAGNOSIS — E559 Vitamin D deficiency, unspecified: Secondary | ICD-10-CM

## 2020-04-10 MED ORDER — VITAMIN D (ERGOCALCIFEROL) 1.25 MG (50000 UNIT) PO CAPS: 50000.0000 [IU] | ORAL_CAPSULE | ORAL | 0 refills | Status: DC

## 2020-04-12 NOTE — Progress Notes (Signed)
Left detailed message on vm, per FYI.

## 2020-05-07 DIAGNOSIS — L089 Local infection of the skin and subcutaneous tissue, unspecified: Secondary | ICD-10-CM | POA: Diagnosis not present

## 2020-07-05 ENCOUNTER — Other Ambulatory Visit: Payer: Self-pay

## 2020-07-08 ENCOUNTER — Encounter: Payer: BC Managed Care – PPO | Admitting: Family Medicine

## 2020-07-11 ENCOUNTER — Ambulatory Visit (INDEPENDENT_AMBULATORY_CARE_PROVIDER_SITE_OTHER): Payer: BC Managed Care – PPO | Admitting: Family Medicine

## 2020-07-11 ENCOUNTER — Other Ambulatory Visit: Payer: Self-pay

## 2020-07-11 ENCOUNTER — Other Ambulatory Visit: Payer: Self-pay | Admitting: Family Medicine

## 2020-07-11 ENCOUNTER — Encounter: Payer: Self-pay | Admitting: Family Medicine

## 2020-07-11 VITALS — BP 130/90 | Temp 98.6°F | Ht 71.5 in | Wt 214.2 lb

## 2020-07-11 DIAGNOSIS — U099 Post covid-19 condition, unspecified: Secondary | ICD-10-CM

## 2020-07-11 DIAGNOSIS — E559 Vitamin D deficiency, unspecified: Secondary | ICD-10-CM

## 2020-07-11 DIAGNOSIS — Z0000 Encounter for general adult medical examination without abnormal findings: Secondary | ICD-10-CM

## 2020-07-11 DIAGNOSIS — Z308 Encounter for other contraceptive management: Secondary | ICD-10-CM

## 2020-07-11 DIAGNOSIS — R635 Abnormal weight gain: Secondary | ICD-10-CM

## 2020-07-11 DIAGNOSIS — R5382 Chronic fatigue, unspecified: Secondary | ICD-10-CM

## 2020-07-11 DIAGNOSIS — N912 Amenorrhea, unspecified: Secondary | ICD-10-CM

## 2020-07-11 DIAGNOSIS — G9332 Myalgic encephalomyelitis/chronic fatigue syndrome: Secondary | ICD-10-CM

## 2020-07-11 LAB — CBC WITH DIFFERENTIAL/PLATELET
Basophils Absolute: 0 10*3/uL (ref 0.0–0.1)
Basophils Relative: 0.7 % (ref 0.0–3.0)
Eosinophils Absolute: 0.1 10*3/uL (ref 0.0–0.7)
Eosinophils Relative: 1.3 % (ref 0.0–5.0)
HCT: 40.1 % (ref 36.0–46.0)
Hemoglobin: 13.7 g/dL (ref 12.0–15.0)
Lymphocytes Relative: 26.1 % (ref 12.0–46.0)
Lymphs Abs: 1.8 10*3/uL (ref 0.7–4.0)
MCHC: 34.1 g/dL (ref 30.0–36.0)
MCV: 93.3 fl (ref 78.0–100.0)
Monocytes Absolute: 0.6 10*3/uL (ref 0.1–1.0)
Monocytes Relative: 9.2 % (ref 3.0–12.0)
Neutro Abs: 4.3 10*3/uL (ref 1.4–7.7)
Neutrophils Relative %: 62.7 % (ref 43.0–77.0)
Platelets: 253 10*3/uL (ref 150.0–400.0)
RBC: 4.29 Mil/uL (ref 3.87–5.11)
RDW: 13.4 % (ref 11.5–15.5)
WBC: 6.9 10*3/uL (ref 4.0–10.5)

## 2020-07-11 LAB — LIPID PANEL
Cholesterol: 163 mg/dL (ref 0–200)
HDL: 77.6 mg/dL (ref >39.00)
LDL Cholesterol: 72 mg/dL (ref 0–99)
NonHDL: 85.51
Total CHOL/HDL Ratio: 2
Triglycerides: 66 mg/dL (ref 0.0–149.0)
VLDL: 13.2 mg/dL (ref 0.0–40.0)

## 2020-07-11 LAB — COMPREHENSIVE METABOLIC PANEL
ALT: 13 U/L (ref 0–35)
AST: 19 U/L (ref 0–37)
Albumin: 4.6 g/dL (ref 3.5–5.2)
Alkaline Phosphatase: 45 U/L (ref 39–117)
BUN: 10 mg/dL (ref 6–23)
CO2: 28 mEq/L (ref 19–32)
Calcium: 9.4 mg/dL (ref 8.4–10.5)
Chloride: 101 mEq/L (ref 96–112)
Creatinine, Ser: 0.75 mg/dL (ref 0.40–1.20)
GFR: 107.41 mL/min (ref >60.00)
Glucose, Bld: 74 mg/dL (ref 70–99)
Potassium: 4.1 mEq/L (ref 3.5–5.1)
Sodium: 138 mEq/L (ref 135–145)
Total Bilirubin: 0.7 mg/dL (ref 0.2–1.2)
Total Protein: 7.3 g/dL (ref 6.0–8.3)

## 2020-07-11 LAB — T4, FREE: Free T4: 0.91 ng/dL (ref 0.60–1.60)

## 2020-07-11 LAB — HCG, QUANTITATIVE, PREGNANCY: Quantitative HCG: <0.6 m[IU]/mL

## 2020-07-11 LAB — VITAMIN D 25 HYDROXY (VIT D DEFICIENCY, FRACTURES): VITD: 32.64 ng/mL (ref 30.00–100.00)

## 2020-07-11 LAB — HEMOGLOBIN A1C: Hgb A1c MFr Bld: 5.3 % (ref 4.6–6.5)

## 2020-07-11 LAB — FOLATE: Folate: 19.5 ng/mL (ref >5.9)

## 2020-07-11 LAB — TSH: TSH: 0.55 u[IU]/mL (ref 0.35–5.50)

## 2020-07-11 LAB — VITAMIN B12: Vitamin B-12: 495 pg/mL (ref 211–911)

## 2020-07-11 MED ORDER — NORETHIN ACE-ETH ESTRAD-FE 1.5-30 MG-MCG PO TABS: 1.0000 | ORAL_TABLET | Freq: Every day | ORAL | 11 refills | Status: DC

## 2020-07-11 NOTE — Progress Notes (Signed)
Subjective:     Karen Martin is a 30 y.o. female and is here for a comprehensive physical exam. The patient reports continued weight gain.  Was 205 in March, now 215 lbs.  Typically more comfortable around 185 lbs.  Pt also notes absent menses.  LMP May 2022.  Has taken 3 negative home pregnancy tests.  No currently taking OCPs.  Does not recall any issues with the medication.  Pt incquires about b12 shot.  Having continued fatigue s/p COVID infection.  Social History   Socioeconomic History   Marital status: Single    Spouse name: Not on file   Number of children: Not on file   Years of education: Not on file   Highest education level: Not on file  Occupational History   Not on file  Tobacco Use   Smoking status: Every Day    Packs/day: 1.00    Years: 8.00    Pack years: 8.00    Types: Cigarettes   Smokeless tobacco: Never  Substance and Sexual Activity   Alcohol use: Yes    Alcohol/week: 6.0 standard drinks    Types: 6 Cans of beer per week    Comment: 5 times a week   Drug use: No   Sexual activity: Not on file  Other Topics Concern   Not on file  Social History Narrative   Not on file   Social Determinants of Health   Financial Resource Strain: Not on file  Food Insecurity: Not on file  Transportation Needs: Not on file  Physical Activity: Not on file  Stress: Not on file  Social Connections: Not on file  Intimate Partner Violence: Not on file   Health Maintenance  Topic Date Due   Hepatitis C Screening  Never done   COVID-19 Vaccine (3 - Pfizer risk series) 07/27/2020 (Originally 04/25/2019)   Pneumococcal Vaccine 60-14 Years old (1 - PCV) 07/11/2021 (Originally 11/16/1996)   TETANUS/TDAP  07/11/2021 (Originally 11/16/2009)   INFLUENZA VACCINE  08/12/2020   PAP-Cervical Cytology Screening  12/21/2021   PAP SMEAR-Modifier  12/21/2021   HIV Screening  Completed   HPV VACCINES  Aged Out    The following portions of the patient's history were reviewed and  updated as appropriate: allergies, current medications, past family history, past medical history, past social history, past surgical history, and problem list.  Review of Systems Pertinent items noted in HPI and remainder of comprehensive ROS otherwise negative.   Objective:    BP 130/90 (BP Location: Left Arm, Patient Position: Sitting, Cuff Size: Normal)   Temp 98.6 F (37 C) (Oral)   Ht 5' 11.5" (1.816 m)   Wt 214 lb 3.2 oz (97.2 kg)   LMP 05/12/2020 (Approximate)   BMI 29.46 kg/m  General appearance: alert, cooperative, and no distress Head: Normocephalic, without obvious abnormality, atraumatic Eyes: conjunctivae/corneas clear. PERRL, EOM's intact. Fundi benign. Ears: normal TM's and external ear canals both ears Nose: Nares normal. Septum midline. Mucosa normal. No drainage or sinus tenderness. Throat: lips, mucosa, and tongue normal; teeth and gums normal Neck: no adenopathy, no carotid bruit, no JVD, supple, symmetrical, trachea midline, and thyroid not enlarged, symmetric, no tenderness/mass/nodules Lungs: clear to auscultation bilaterally Heart: regular rate and rhythm, S1, S2 normal, no murmur, click, rub or gallop Abdomen: soft, non-tender; bowel sounds normal; no masses,  no organomegaly Extremities: extremities normal, atraumatic, no cyanosis or edema Pulses: 2+ and symmetric Skin: Skin color, texture, turgor normal. No rashes or lesions Lymph nodes: Cervical, supraclavicular,  and axillary nodes normal. Neurologic: Alert and oriented X 3, normal strength and tone. Normal symmetric reflexes. Normal coordination and gait    Assessment:    Healthy female exam.      Plan:    Anticipatory guidance given including wearing seatbelts, smoke detectors in the home, increasing physical activity, increasing p.o. intake of water and vegetables. -order labs -given handout -next CPE in 1 yr See After Visit Summary for Counseling Recommendations   Weight gain  -discussed  various causes -lifestyle modifications - Plan: CBC with Differential/Platelet, TSH, T4, Free  Post-COVID chronic fatigue -consider Zinc, magnesium, calcium supplements -recheck vit D, low 27.81 on 04/05/20  - Plan: TSH, T4, Free, CMP, Vitamin B12, Vitamin D, 25-hydroxy, Folate  Absent menses  -LMP 06/2020 - Plan: HCG, Quant, Pregnancy, TSH, T4, Free, Hemoglobin A1c  Encounter for other contraceptive management  - Plan: norethindrone-ethinyl estradiol-iron (AUROVELA FE 1.5/30) 1.5-30 MG-MCG tablet  F/u prn in 1 month  Abbe Amsterdam, MD

## 2020-07-12 ENCOUNTER — Other Ambulatory Visit: Payer: Self-pay | Admitting: Family Medicine

## 2020-07-12 DIAGNOSIS — N911 Secondary amenorrhea: Secondary | ICD-10-CM

## 2020-07-12 DIAGNOSIS — R635 Abnormal weight gain: Secondary | ICD-10-CM

## 2020-07-16 ENCOUNTER — Telehealth: Payer: Self-pay | Admitting: Family Medicine

## 2020-07-16 NOTE — Telephone Encounter (Signed)
Karen Martin is needing a call back with clarification on [facility].

## 2020-07-17 NOTE — Telephone Encounter (Signed)
Spoke with Karen Martin, informed her of what was needed with/for the [place].

## 2020-07-18 ENCOUNTER — Encounter: Payer: Self-pay | Admitting: Family Medicine

## 2020-07-31 ENCOUNTER — Encounter: Payer: Self-pay | Admitting: Family Medicine

## 2020-08-06 ENCOUNTER — Other Ambulatory Visit: Payer: Self-pay

## 2020-08-06 ENCOUNTER — Ambulatory Visit
Admission: RE | Admit: 2020-08-06 | Discharge: 2020-08-06 | Disposition: A | Discharge status: Home / Self Care | Payer: BC Managed Care – PPO | Source: Ambulatory Visit | Attending: Family Medicine | Admitting: Family Medicine

## 2020-08-06 DIAGNOSIS — N920 Excessive and frequent menstruation with regular cycle: Secondary | ICD-10-CM | POA: Diagnosis not present

## 2020-08-06 DIAGNOSIS — N912 Amenorrhea, unspecified: Secondary | ICD-10-CM | POA: Diagnosis not present

## 2020-08-06 DIAGNOSIS — N911 Secondary amenorrhea: Secondary | ICD-10-CM

## 2020-08-06 DIAGNOSIS — R635 Abnormal weight gain: Secondary | ICD-10-CM | POA: Diagnosis not present

## 2020-09-19 ENCOUNTER — Other Ambulatory Visit: Payer: Self-pay

## 2020-09-20 ENCOUNTER — Ambulatory Visit: Payer: BC Managed Care – PPO | Admitting: Family Medicine

## 2020-09-20 ENCOUNTER — Encounter: Payer: Self-pay | Admitting: Family Medicine

## 2020-09-20 VITALS — BP 120/78 | HR 64 | Temp 98.7°F | Wt 224.2 lb

## 2020-09-20 DIAGNOSIS — K219 Gastro-esophageal reflux disease without esophagitis: Secondary | ICD-10-CM

## 2020-09-20 DIAGNOSIS — R635 Abnormal weight gain: Secondary | ICD-10-CM | POA: Diagnosis not present

## 2020-09-20 DIAGNOSIS — L680 Hirsutism: Secondary | ICD-10-CM | POA: Diagnosis not present

## 2020-09-20 NOTE — Progress Notes (Signed)
Subjective:    Patient ID: Karen Martin, female    DOB: November 03, 1990, 30 y.o.   MRN: [id number]  Chief Complaint  Patient presents with   Weight Check    Discuss weight management, not sleeping well, not digesting well.    HPI Patient was seen today for ongoing weight gain.  Patient gained 10 pounds since last OFV 07/11/2020.  Patient working out regularly, eating healthy, drinking mostly water.  Patient is starting to develop heartburn at night that causes emesis.  Patient feeling self-conscious/depressed about increased weight.  Denies skin changes, vision changes, HTN, family history of any weight changes.  Ultrasound to evaluate for PCOS negative.  07/07/2019 208 LBS.  200 LBS 12/22/2018 No past medical history on file.  No Known Allergies  ROS General: Denies fever, chills, night sweats, changes in weight, changes in appetite +weight gain HEENT: Denies headaches, ear pain, changes in vision, rhinorrhea, sore throat CV: Denies CP, palpitations, SOB, orthopnea Pulm: Denies SOB, cough, wheezing GI: Denies abdominal pain, nausea, vomiting, diarrhea, constipation  +acid reflux GU: Denies dysuria, hematuria, frequency, vaginal discharge Msk: Denies muscle cramps, joint pains Neuro: Denies weakness, numbness, tingling Skin: Denies rashes, bruising  +hirsutism Psych: Denies anxiety, hallucinations +depression     Objective:    Blood pressure 120/78, pulse 64, temperature 98.7 F (37.1 C), temperature source Oral, weight 224 lb 3.2 oz (101.7 kg), SpO2 97 %.  Gen. Pleasant, well-nourished, in no distress, normal affect   HEENT: [place]/AT, face symmetric, mild hirsutism, conjunctiva clear, no scleral icterus, PERRLA, EOMI, nares patent without drainage Lungs: no accessory muscle use Cardiovascular: RRR, no peripheral edema Musculoskeletal: No deformities, no cyanosis or clubbing, normal tone Neuro:  A&Ox3, CN II-XII intact, normal gait Skin:  Warm, no lesions/ rash, no abdominal  striae, no increased posterior cervical adipose deposition, an no increased supraclavicular adipose deposition   Wt Readings from Last 3 Encounters:  09/20/20 224 lb 3.2 oz (101.7 kg)  07/11/20 214 lb 3.2 oz (97.2 kg)  04/05/20 205 lb 6.4 oz (93.2 kg)    Lab Results  Component Value Date   WBC 6.9 07/11/2020   HGB 13.7 07/11/2020   HCT 40.1 07/11/2020   PLT 253.0 07/11/2020   GLUCOSE 74 07/11/2020   CHOL 163 07/11/2020   TRIG 66.0 07/11/2020   HDL 77.60 07/11/2020   LDLCALC 72 07/11/2020   ALT 13 07/11/2020   AST 19 07/11/2020   NA 138 07/11/2020   K 4.1 07/11/2020   CL 101 07/11/2020   CREATININE 0.75 07/11/2020   BUN 10 07/11/2020   CO2 28 07/11/2020   TSH 0.55 07/11/2020   HGBA1C 5.3 07/11/2020    Assessment/Plan:  Weight gain  -10 lbs wt gain since last OFV 07/11/20 despite consistent lifestyle modifications -Previous testing for thyroid dysfunction and diabetes normal.  U hCG, Potassium, vit D, and b12 normal. -u/s negative for PCOS -dexamethasone suppression test -for continued wt gain referral to Endo and imaging - Plan: Cortisol, dexamethasone (DECADRON) 1 MG tablet  Gastroesophageal reflux disease, unspecified whether esophagitis present -likely 2/2 wt gain -lifestyle modifications  Hirsutism  - Plan: Cortisol, dexamethasone (DECADRON) 1 MG tablet  F/u in 1-2 wks  Abbe Amsterdam, MD

## 2020-09-20 NOTE — Patient Instructions (Signed)
We will obtain labs next week to further evaluate your weight gain.  If needed we will obtain imaging based on the labs.  I provided some information about C[place]'s which is one of the disorders we are trying to evaluate for.

## 2020-09-23 ENCOUNTER — Other Ambulatory Visit: Payer: BC Managed Care – PPO

## 2020-09-23 ENCOUNTER — Encounter: Payer: Self-pay | Admitting: Family Medicine

## 2020-09-23 MED ORDER — DEXAMETHASONE 1 MG PO TABS: 1.0000 mg | ORAL_TABLET | Freq: Once | ORAL | 0 refills | Status: AC

## 2020-09-24 ENCOUNTER — Other Ambulatory Visit: Payer: Self-pay

## 2020-09-24 ENCOUNTER — Other Ambulatory Visit (INDEPENDENT_AMBULATORY_CARE_PROVIDER_SITE_OTHER): Payer: BC Managed Care – PPO

## 2020-09-24 DIAGNOSIS — R635 Abnormal weight gain: Secondary | ICD-10-CM | POA: Diagnosis not present

## 2020-09-24 DIAGNOSIS — N911 Secondary amenorrhea: Secondary | ICD-10-CM | POA: Diagnosis not present

## 2020-09-24 DIAGNOSIS — L680 Hirsutism: Secondary | ICD-10-CM | POA: Diagnosis not present

## 2020-09-24 LAB — FOLLICLE STIMULATING HORMONE: FSH: 6.5 m[IU]/mL

## 2020-09-24 LAB — TESTOSTERONE: Testosterone: 30.9 ng/dL (ref 15.00–40.00)

## 2020-09-24 LAB — ESTRADIOL: Estradiol: 58 pg/mL

## 2020-09-24 LAB — CORTISOL: Cortisol, Plasma: 0.6 ug/dL

## 2020-09-24 LAB — PROLACTIN: Prolactin: 5.6 ng/mL

## 2020-09-25 ENCOUNTER — Encounter: Payer: Self-pay | Admitting: Orthopaedic Surgery

## 2020-09-25 ENCOUNTER — Encounter: Payer: Self-pay | Admitting: Family Medicine

## 2020-09-25 ENCOUNTER — Ambulatory Visit: Payer: Self-pay

## 2020-09-25 ENCOUNTER — Ambulatory Visit (INDEPENDENT_AMBULATORY_CARE_PROVIDER_SITE_OTHER): Payer: BC Managed Care – PPO | Admitting: Orthopaedic Surgery

## 2020-09-25 DIAGNOSIS — M25572 Pain in left ankle and joints of left foot: Secondary | ICD-10-CM | POA: Diagnosis not present

## 2020-09-25 NOTE — Progress Notes (Signed)
Office Visit Note   Patient: Karen Martin           Date of Birth: Dec 20, 1990           MRN: [id number] Visit Date: 09/25/2020              Requested by: Deeann Saint, MD [address] [place],  [place] [address] PCP: Deeann Saint, MD   Assessment & Plan: Visit Diagnoses:  1. Pain in left ankle and joints of left foot     Plan: Given the pain the patient is experiencing in her left foot, I feel that a postop shoe would be helpful with her mobility and ambulating.  I gave her reassurance that I do not see a fracture.  However, we can see her in 3 weeks because if she still having pain I would get a repeat 3 views of the left foot.  She will slowly increase activities as comfort allows.  All questions and concerns were answered and addressed.  Follow-Up Instructions: Return in about 3 weeks (around 10/16/2020).   Orders:  Orders Placed This Encounter  Procedures   XR Foot Complete Left   No orders of the defined types were placed in this encounter.     Procedures: No procedures performed   Clinical Data: No additional findings.   Subjective: Chief Complaint  Patient presents with   Left Foot - New Patient (Initial Visit)  The patient is a 30 year old man seen for the first time.  She injured her foot yesterday morning when she jammed it against something.  She has a history of a previous foot fracture in 2017 with her left foot.  She said she did not think it was a big deal but it was very tender along the lateral aspect of her left foot.  She then laid down and then today has had worsening pain with the left foot is causing her to actually walk different and walk with the inside part of her left foot.  She does perform mainly sedentary type of sitdown work but she is having definitely pain in the left foot.  HPI  Review of Systems There is currently no headache, chest pain, shortness of breath, fever, chills, nausea, vomiting  Objective: Vital  Signs: There were no vitals taken for this visit.  Physical Exam She is alert and orient x3 and in no acute distress Ortho Exam Examination of her left foot shows pain over the fifth ray.  The remainder the foot exam is normal.  There is no bruising or swelling but it is definitely painful to her.  This is causing her to have some limited flexion extension of her toes and ankle secondary to her pain. Specialty Comments:  No specialty comments available.  Imaging: XR Foot Complete Left  Result Date: 09/25/2020 3 views of the left foot show no acute findings.  I cannot see any evidence of a fracture or malalignment.    PMFS History: There are no problems to display for this patient.  History reviewed. No pertinent past medical history.  History reviewed. No pertinent family history.  History reviewed. No pertinent surgical history. Social History   Occupational History   Not on file  Tobacco Use   Smoking status: Every Day    Packs/day: 1.00    Years: 8.00    Pack years: 8.00    Types: Cigarettes   Smokeless tobacco: Never  Substance and Sexual Activity   Alcohol use: Yes  Alcohol/week: 6.0 standard drinks    Types: 6 Cans of beer per week    Comment: 5 times a week   Drug use: No   Sexual activity: Not on file

## 2020-09-26 ENCOUNTER — Telehealth: Payer: Self-pay

## 2020-09-26 NOTE — Telephone Encounter (Signed)
Called pt, no answer, left VM and sent MyChart message to schedule a virtual visit to discuss the results.

## 2020-09-26 NOTE — Telephone Encounter (Signed)
Patient called requesting lab results I informed patient that she would receive a call when results are available.

## 2020-09-27 ENCOUNTER — Telehealth: Payer: BC Managed Care – PPO | Admitting: Family Medicine

## 2020-09-27 ENCOUNTER — Encounter: Payer: Self-pay | Admitting: Family Medicine

## 2020-09-27 DIAGNOSIS — R635 Abnormal weight gain: Secondary | ICD-10-CM

## 2020-09-27 DIAGNOSIS — Z712 Person consulting for explanation of examination or test findings: Secondary | ICD-10-CM

## 2020-09-27 NOTE — Progress Notes (Signed)
Pt reviewed recent lab results and result note comments.  Brief telephone, regarding results, less than 4 minutes.  Dexamethasone suppression test normal.  Patient okay with endocrinology referral.  Patient expresses body image concerns given increased unintentional weight gain despite healthy lifestyle.  Patient inquires about weight loss medications as would like to lose 30 pounds.  Will await input from endocrinology before starting weight loss meds.  Patient expresses understanding.  Abbe Amsterdam, MD

## 2020-10-01 ENCOUNTER — Encounter: Payer: Self-pay | Admitting: Family Medicine

## 2020-10-02 ENCOUNTER — Encounter: Payer: Self-pay | Admitting: Family Medicine

## 2020-10-03 ENCOUNTER — Ambulatory Visit
Admission: EM | Admit: 2020-10-03 | Discharge: 2020-10-03 | Disposition: A | Discharge status: Home / Self Care | Payer: BC Managed Care – PPO | Attending: Emergency Medicine | Admitting: Emergency Medicine

## 2020-10-03 ENCOUNTER — Other Ambulatory Visit: Payer: Self-pay

## 2020-10-03 DIAGNOSIS — M546 Pain in thoracic spine: Secondary | ICD-10-CM | POA: Diagnosis not present

## 2020-10-03 LAB — POCT URINALYSIS DIP (MANUAL ENTRY)
Bilirubin, UA: NEGATIVE
Blood, UA: NEGATIVE
Glucose, UA: NEGATIVE mg/dL
Ketones, POC UA: NEGATIVE mg/dL
Leukocytes, UA: NEGATIVE
Nitrite, UA: NEGATIVE
Protein Ur, POC: NEGATIVE mg/dL
Spec Grav, UA: 1.025 (ref 1.010–1.025)
Urobilinogen, UA: 0.2 E.U./dL
pH, UA: 7.5 (ref 5.0–8.0)

## 2020-10-03 LAB — POCT URINE PREGNANCY: Preg Test, Ur: NEGATIVE

## 2020-10-03 MED ORDER — KETOROLAC TROMETHAMINE 30 MG/ML IJ SOLN
30.0000 mg | Freq: Once | INTRAMUSCULAR | Status: AC
  Administered 2020-10-03: 30 mg via INTRAMUSCULAR

## 2020-10-03 MED ORDER — BACLOFEN 10 MG PO TABS: 10.0000 mg | ORAL_TABLET | Freq: Every day | ORAL | 0 refills | Status: AC

## 2020-10-03 NOTE — ED Provider Notes (Signed)
[facility]    CSN: [id number] Arrival date & time: 10/03/20  1037      History   Chief Complaint Chief Complaint  Patient presents with   Flank Pain    HPI Karen Martin is a 30 y.o. female.   Pt reports lower left back pain for a week, patient states pain wakes her at night.  Patient denies increased frequency of urination, burning with urination, pelvic pressure, incomplete emptying, urine malodor, hematuria.  Patient states she has been using a heating pad on her left side with no relief.  States pain is constant.  Patient also reports having broken her left foot 5 years ago, states she is very active and participates in a lot of outdoor exercise activities.  Patient states that about 2 weeks ago, she was running and had pain in her left foot which "took me out", patient is wearing a soft boot in clinic today which she states she began wearing when her foot started hurting.  Urine dip today is unremarkable.  The history is provided by the patient.   No past medical history on file.  There are no problems to display for this patient.   No past surgical history on file.  OB History   No obstetric history on file.      Home Medications    Prior to Admission medications   Medication Sig Start Date End Date Taking? Authorizing Provider  baclofen (LIORESAL) 10 MG tablet Take 1 tablet (10 mg total) by mouth at bedtime for 5 days. 10/03/20 10/08/20 Yes Theadora Rama Scales, PA-C  BIOTIN PO Take by mouth.    [provider]  Multiple Vitamins-Minerals (MULTIVITAMIN WOMEN PO) Take 2 each by mouth daily.    [provider]  norethindrone-ethinyl estradiol-iron (AUROVELA FE 1.5/30) 1.5-30 MG-MCG tablet Take 1 tablet by mouth daily. 07/11/20   Deeann Saint, MD  Probiotic Product (PROBIOTIC-10) CHEW Chew by mouth.    [provider]  Vitamin D, Ergocalciferol, (DRISDOL) 1.25 MG (50000 UNIT) CAPS capsule Take 1 capsule (50,000 Units  total) by mouth every 7 (seven) days. Patient not taking: No sig reported 04/10/20   Deeann Saint, MD    Family History No family history on file.  Social History Social History   Tobacco Use   Smoking status: Every Day    Packs/day: 1.00    Years: 8.00    Pack years: 8.00    Types: Cigarettes   Smokeless tobacco: Never  Vaping Use   Vaping Use: Never used  Substance Use Topics   Alcohol use: Yes    Alcohol/week: 6.0 standard drinks    Types: 6 Cans of beer per week    Comment: 5 times a week   Drug use: No     Allergies   Patient has no known allergies.   Review of Systems Review of Systems Per HPI  Physical Exam Triage Vital Signs ED Triage Vitals  Enc Vitals Group     BP      Pulse      Resp      Temp      Temp src      SpO2      Weight      Height      Head Circumference      Peak Flow      Pain Score      Pain Loc      Pain Edu?      Excl. in  GC?    No data found.  Updated Vital Signs BP 135/83 (BP Location: Left Arm)   Pulse 75   Temp 98.1 F (36.7 C) (Oral)   Resp 18   SpO2 97%   Visual Acuity Right Eye Distance:   Left Eye Distance:   Bilateral Distance:    Right Eye Near:   Left Eye Near:    Bilateral Near:     Physical Exam Vitals and nursing note reviewed.  Constitutional:      Appearance: Normal appearance.  Cardiovascular:     Rate and Rhythm: Normal rate and regular rhythm.     Heart sounds: Normal heart sounds.  Pulmonary:     Effort: Pulmonary effort is normal.     Breath sounds: Normal breath sounds.  Abdominal:     General: Abdomen is flat. Bowel sounds are normal.     Palpations: Abdomen is soft.  Musculoskeletal:        General: Normal range of motion.     Cervical back: Normal range of motion and neck supple.     Comments: Patient does not have any point tenderness at left flank or left side of back.  No muscle spasm appreciated on palpation.  Skin:    General: Skin is warm and dry.  Neurological:      General: No focal deficit present.     Mental Status: She is alert and oriented to person, place, and time. Mental status is at baseline.  Psychiatric:        Mood and Affect: Mood normal.        Behavior: Behavior normal.     UC Treatments / Results  Labs (all labs ordered are listed, but only abnormal results are displayed) Labs Reviewed  URINE CULTURE  POCT URINALYSIS DIP (MANUAL ENTRY)  POCT URINE PREGNANCY    EKG   Radiology No results found.  Procedures Procedures (including critical care time)  Medications Ordered in UC Medications  ketorolac (TORADOL) 30 MG/ML injection 30 mg (has no administration in time range)    Initial Impression / Assessment and Plan / UC Course  I have reviewed the triage vital signs and the nursing notes.  Pertinent labs & imaging results that were available during my care of the patient were reviewed by me and considered in my medical decision making (see chart for details).     Physical exam is unremarkable today, urine dip is normal.  Discussed with patient.  We will send urine out for culture to be complete.  Patient was agreeable to ketorolac injection today for pain as well as a muscle relaxer for her to take at night to see if this gives her any relief.  Patient advised that if urine culture is positive we will certainly treat her with antibiotic therapy.  Patient verbalized understanding agreement with plan, all questions were addressed. Final Clinical Impressions(s) / UC Diagnoses   Final diagnoses:  Lower thoracic back pain     Discharge Instructions      As we discussed, in the absence of any lower urinary tract symptoms preceding the onset of your left-sided pain as well as normal urinalysis during her visit today, I do not feel that antibiotics are appropriate at this time.  However, we will send urine out for culture and if there are bacteria present we will treat you for upper urinary tract appropriately.  In the  meantime, you received an injection of ketorolac for your pain and a muscle relaxer to take at  bedtime for the next 5 days.     ED Prescriptions     Medication Sig Dispense Auth. Provider   baclofen (LIORESAL) 10 MG tablet Take 1 tablet (10 mg total) by mouth at bedtime for 5 days. 5 tablet Theadora Rama Scales, PA-C      PDMP not reviewed this encounter.   Theadora Rama Scales, PA-C 10/03/20 1116

## 2020-10-03 NOTE — ED Triage Notes (Signed)
Pt reports lower left back pain for a week, patient states she has been waking up more frequently to urinate at night. Pt denies having history of diabetes and HTN, she denies vision changes and increase in thirst.   Intervention: heating pad - pt states is helpful

## 2020-10-03 NOTE — Discharge Instructions (Addendum)
As we discussed, in the absence of any lower urinary tract symptoms preceding the onset of your left-sided pain as well as normal urinalysis during her visit today, I do not feel that antibiotics are appropriate at this time.  However, we will send urine out for culture and if there are bacteria present we will treat you for upper urinary tract appropriately.  In the meantime, you received an injection of ketorolac for your pain and a muscle relaxer to take at bedtime for the next 5 days.

## 2020-10-04 LAB — URINE CULTURE
Culture: NO GROWTH
Special Requests: NORMAL

## 2020-11-27 ENCOUNTER — Telehealth: Payer: Self-pay | Admitting: Orthopaedic Surgery

## 2020-11-27 NOTE — Telephone Encounter (Signed)
Patient c/o increasing right arm pain from mid-forearm down into her wrist since Saturday. Occurred while moving into new apartment. She has been resting and icing her arm since Saturday but has not seen improvement. She wants to know if she should continue with icing or should she make an appt to be seen.

## 2020-11-28 ENCOUNTER — Other Ambulatory Visit (HOSPITAL_BASED_OUTPATIENT_CLINIC_OR_DEPARTMENT_OTHER): Payer: Self-pay | Admitting: Orthopaedic Surgery

## 2020-11-28 ENCOUNTER — Ambulatory Visit (HOSPITAL_BASED_OUTPATIENT_CLINIC_OR_DEPARTMENT_OTHER)
Admission: RE | Admit: 2020-11-28 | Discharge: 2020-11-28 | Disposition: A | Discharge status: Home / Self Care | Payer: BC Managed Care – PPO | Source: Ambulatory Visit | Attending: Orthopaedic Surgery | Admitting: Orthopaedic Surgery

## 2020-11-28 ENCOUNTER — Other Ambulatory Visit: Payer: Self-pay

## 2020-11-28 ENCOUNTER — Other Ambulatory Visit (HOSPITAL_BASED_OUTPATIENT_CLINIC_OR_DEPARTMENT_OTHER): Payer: Self-pay

## 2020-11-28 ENCOUNTER — Ambulatory Visit (HOSPITAL_BASED_OUTPATIENT_CLINIC_OR_DEPARTMENT_OTHER): Payer: BC Managed Care – PPO | Admitting: Orthopaedic Surgery

## 2020-11-28 DIAGNOSIS — M79631 Pain in right forearm: Secondary | ICD-10-CM | POA: Insufficient documentation

## 2020-11-28 DIAGNOSIS — M65831 Other synovitis and tenosynovitis, right forearm: Secondary | ICD-10-CM

## 2020-11-28 DIAGNOSIS — M25572 Pain in left ankle and joints of left foot: Secondary | ICD-10-CM

## 2020-11-28 MED ORDER — METHYLPREDNISOLONE 4 MG PO TBPK
ORAL_TABLET | ORAL | 0 refills | Status: DC
  Filled 2020-11-28 (×3): qty 21, 6d supply, fill #0

## 2020-11-28 NOTE — Progress Notes (Signed)
Chief Complaint: right forearm pain     History of Present Illness:   Karen Martin is a 30 y.o. female with right forearm pain now going on for a week after she was vacuuming extensively during a move.  She states that the pain is occurring on the dorsal aspect of the forearm.  There is swelling along with weakness.  She is tender to palpation about the dorsal aspect of the forearm.  She is having difficult time sleeping.  She has taken muscle relaxers as well as Tylenol and Advil.  This is not helping.  Ice and heat are not helping as well.  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  She is hoping to be able to return to a half marathon this upcoming weekend.    Surgical History:   Previous ankle ORIF by Dr. Magnus Ivan  PMH/PSH/Family History/Social History/Meds/Allergies:   No past medical history on file. No past surgical history on file. Social History   Socioeconomic History   Marital status: Single    Spouse name: Not on file   Number of children: Not on file   Years of education: Not on file   Highest education level: Not on file  Occupational History   Not on file  Tobacco Use   Smoking status: Every Day    Packs/day: 1.00    Years: 8.00    Pack years: 8.00    Types: Cigarettes   Smokeless tobacco: Never  Vaping Use   Vaping Use: Never used  Substance and Sexual Activity   Alcohol use: Yes    Alcohol/week: 6.0 standard drinks    Types: 6 Cans of beer per week    Comment: 5 times a week   Drug use: No   Sexual activity: Not on file  Other Topics Concern   Not on file  Social History Narrative   Not on file   Social Determinants of Health   Financial Resource Strain: Not on file  Food Insecurity: Not on file  Transportation Needs: Not on file  Physical Activity: Not on file  Stress: Not on file  Social Connections: Not on file   No family history on file. No Known Allergies Current  Outpatient Medications  Medication Sig Dispense Refill   BIOTIN PO Take by mouth.     Multiple Vitamins-Minerals (MULTIVITAMIN WOMEN PO) Take 2 each by mouth daily.     norethindrone-ethinyl estradiol-iron (AUROVELA FE 1.5/30) 1.5-30 MG-MCG tablet Take 1 tablet by mouth daily. 28 tablet 11   Probiotic Product (PROBIOTIC-10) CHEW Chew by mouth.     Vitamin D, Ergocalciferol, (DRISDOL) 1.25 MG (50000 UNIT) CAPS capsule Take 1 capsule (50,000 Units total) by mouth every 7 (seven) days. (Patient not taking: No sig reported) 12 capsule 0   No current facility-administered medications for this visit.   No results found.  Review of Systems:   A ROS was performed including pertinent positives and negatives as documented in the HPI.  Physical Exam :   Constitutional: NAD and appears stated age Neurological: Alert and oriented Psych: Appropriate affect and cooperative There were no vitals taken for this visit.   Comprehensive Musculoskeletal Exam:    She is tender to palpation about the dorsal first comport compartment of the forearm.  She has pain with resisted extension  of the thumb.  She is able to actively extend at all of her digits as well as thumb.  There is some swelling about the dorsal aspect of the forearm.  There is no redness or erythema  Imaging:   Xray (right radius and ulna 2 views): Normal   I personally reviewed and interpreted the radiographs.   Assessment:   30 year old female with right first dorsal compartment tenosynovitis after a repetitive vacuuming incident the previous week.  At this time I would like to prescribe her Medrol Dosepak in order to help with the acute inflammation.  I have ultimately advised that rest would be the best thing for her.  She is quite intent on possibly running a marathon this help with coming week.  I have prescribed her a wrist splint that I would like her to wear while running as I believe this would give her some added stability so as  to not overload the dorsal muscles.  Plan :    -She will follow-up in 2 weeks should she not feel completely better      I personally saw and evaluated the patient, and participated in the management and treatment plan.  Huel Cote, MD Attending Physician, Orthopedic Surgery  This document was dictated using Dragon voice recognition software. A reasonable attempt at proof reading has been made to minimize errors.

## 2020-11-29 ENCOUNTER — Other Ambulatory Visit (HOSPITAL_BASED_OUTPATIENT_CLINIC_OR_DEPARTMENT_OTHER): Payer: Self-pay

## 2020-12-16 ENCOUNTER — Ambulatory Visit: Payer: BC Managed Care – PPO | Admitting: Endocrinology

## 2021-01-16 ENCOUNTER — Encounter: Payer: Self-pay | Admitting: Endocrinology

## 2021-01-16 ENCOUNTER — Ambulatory Visit: Payer: BC Managed Care – PPO | Admitting: Endocrinology

## 2021-01-16 ENCOUNTER — Other Ambulatory Visit: Payer: Self-pay

## 2021-01-16 DIAGNOSIS — R635 Abnormal weight gain: Secondary | ICD-10-CM

## 2021-01-16 NOTE — Progress Notes (Signed)
Subjective:    Patient ID: Karen Martin, female    DOB: 11/27/1990, 31 y.o.   MRN: [id number]  HPI Pt is referred by Dr Salomon Fick.  Pt reports weight gain of 40 lbs x 1 year, despite good diet and exercise.  She has not recently taken any steroids.  She has no h/o cancer, pituitary disorder, cataracts, PUD, HTN, osteoporosis, DM, or adrenal disorder.  She has never had pituitary or adrenal imaging.  She has irreg menses.  She reports insomnia, fatigue, easy bruising, anxiety, and generalized swelling.  She has facial hair x a few mos.  Pt says she is not at risk for pregnancy.    Social History   Socioeconomic History   Marital status: Single    Spouse name: Not on file   Number of children: Not on file   Years of education: Not on file   Highest education level: Not on file  Occupational History   Not on file  Tobacco Use   Smoking status: Every Day    Packs/day: 1.00    Years: 8.00    Pack years: 8.00    Types: Cigarettes   Smokeless tobacco: Never  Vaping Use   Vaping Use: Never used  Substance and Sexual Activity   Alcohol use: Yes    Alcohol/week: 6.0 standard drinks    Types: 6 Cans of beer per week    Comment: 5 times a week   Drug use: No   Sexual activity: Not on file  Other Topics Concern   Not on file  Social History Narrative   Not on file   Social Determinants of Health   Financial Resource Strain: Not on file  Food Insecurity: Not on file  Transportation Needs: Not on file  Physical Activity: Not on file  Stress: Not on file  Social Connections: Not on file  Intimate Partner Violence: Not on file    Current Outpatient Medications on File Prior to Visit  Medication Sig Dispense Refill   BIOTIN PO Take by mouth.     Multiple Vitamins-Minerals (MULTIVITAMIN WOMEN PO) Take 2 each by mouth daily.     norethindrone-ethinyl estradiol-iron (AUROVELA FE 1.5/30) 1.5-30 MG-MCG tablet Take 1 tablet by mouth daily. 28 tablet 11   Probiotic Product  (PROBIOTIC-10) CHEW Chew by mouth.     No current facility-administered medications on file prior to visit.    No Known Allergies  No family history on file.  BP 114/76 (BP Location: Left Arm, Patient Position: Sitting, Cuff Size: Normal)    Pulse 83    Ht 5' 11.5" (1.816 m)    Wt 227 lb 6.4 oz (103.1 kg)    SpO2 96%    BMI 31.27 kg/m    Review of Systems denies headache.      Objective:   Physical Exam VS: see vs page GEN: no distress HEAD: head: no deformity eyes: no periorbital swelling, no proptosis external nose and ears are normal NECK: supple, thyroid is not enlarged CHEST WALL: no deformity LUNGS: clear to auscultation CV: reg rate and rhythm, no murmur.  MUSCULOSKELETAL: gait is normal and steady EXTEMITIES: no deformity.  no leg edema.   NEURO: readily moves all 4's.  sensation is intact to touch on all 4's.   SKIN:  Normal texture and temperature.  No rash or suspicious lesion is visible.  Moderate light hair growth at the ant neck.   NODES:  None palpable at the neck.   PSYCH: alert, well-oriented.  Does not appear anxious nor depressed.    Lab Results  Component Value Date   TSH 0.55 07/11/2020   Lab Results  Component Value Date   CALCIUM 9.4 07/11/2020   Lab Results  Component Value Date   CREATININE 0.75 07/11/2020   BUN 10 07/11/2020   NA 138 07/11/2020   K 4.1 07/11/2020   CL 101 07/11/2020   CO2 28 07/11/2020    ON dex test: cortisol=0.6  I have reviewed outside records, and summarized: Pt was noted to have weight gain, and referred here.  Pt and dr discussed medication for weight loss if eval was neg here     Assessment & Plan:  Weight gain: uncontrolled  Patient Instructions  A 24-HR urine test is requested for you today.  We'll let you know about the results.  I would be happy to see you back here as needed.

## 2021-01-16 NOTE — Patient Instructions (Signed)
A 24-HR urine test is requested for you today.  We'll let you know about the results.  I would be happy to see you back here as needed.

## 2021-01-23 ENCOUNTER — Other Ambulatory Visit: Payer: BC Managed Care – PPO

## 2021-01-23 DIAGNOSIS — R635 Abnormal weight gain: Secondary | ICD-10-CM | POA: Diagnosis not present

## 2021-01-28 LAB — CORTISOL, URINE, 24 HOUR
24 Hour urine volume (VMAHVA): 2200 mL
CREATININE, URINE: 0.8 g/(24.h) (ref 0.50–2.15)
Cortisol (Ur), Free: 12.3 mcg/24 h (ref 4.0–50.0)

## 2021-01-30 ENCOUNTER — Encounter: Payer: Self-pay | Admitting: Family Medicine

## 2021-06-24 ENCOUNTER — Ambulatory Visit (INDEPENDENT_AMBULATORY_CARE_PROVIDER_SITE_OTHER): Payer: BC Managed Care – PPO

## 2021-06-24 ENCOUNTER — Ambulatory Visit
Admission: EM | Admit: 2021-06-24 | Discharge: 2021-06-24 | Disposition: A | Discharge status: Home / Self Care | Payer: BC Managed Care – PPO | Attending: Emergency Medicine | Admitting: Emergency Medicine

## 2021-06-24 DIAGNOSIS — R110 Nausea: Secondary | ICD-10-CM | POA: Diagnosis not present

## 2021-06-24 DIAGNOSIS — K5904 Chronic idiopathic constipation: Secondary | ICD-10-CM

## 2021-06-24 DIAGNOSIS — K5900 Constipation, unspecified: Secondary | ICD-10-CM

## 2021-06-24 DIAGNOSIS — R1013 Epigastric pain: Secondary | ICD-10-CM | POA: Diagnosis not present

## 2021-06-24 LAB — POCT URINALYSIS DIP (MANUAL ENTRY)
Bilirubin, UA: NEGATIVE
Blood, UA: NEGATIVE
Glucose, UA: NEGATIVE mg/dL
Ketones, POC UA: NEGATIVE mg/dL
Leukocytes, UA: NEGATIVE
Nitrite, UA: NEGATIVE
Protein Ur, POC: NEGATIVE mg/dL
Spec Grav, UA: 1.025 (ref 1.010–1.025)
Urobilinogen, UA: 0.2 E.U./dL
pH, UA: 7 (ref 5.0–8.0)

## 2021-06-24 MED ORDER — LANSOPRAZOLE 30 MG PO CPDR: 30.0000 mg | DELAYED_RELEASE_CAPSULE | Freq: Every morning | ORAL | 2 refills | Status: DC

## 2021-06-24 MED ORDER — TRULANCE 3 MG PO TABS: 1.0000 | ORAL_TABLET | Freq: Every morning | ORAL | 2 refills | Status: DC

## 2021-06-24 MED ORDER — ONDANSETRON 4 MG PO TBDP: 4.0000 mg | ORAL_TABLET | Freq: Three times a day (TID) | ORAL | 0 refills | Status: DC | PRN

## 2021-06-24 NOTE — ED Triage Notes (Signed)
Pt states nausea and dull pain to upper abd that started yesterday.  States she took Pepcid and Tums yesterday with no relief.

## 2021-06-24 NOTE — Discharge Instructions (Signed)
The urinalysis that we performed in the clinic today was unremarkable, urine culture will be performed to confirm that you are not experiencing a urinary tract infection at this time.  The x-ray of your abdomen revealed a significant amount of stool that is backed up.  I believe this is the cause of your nausea and discomfort at this time.  For nausea I have sent a prescription for Zofran that you can take every 8 hours as needed.  For your history of heartburn previously well controlled on over-the-counter medications, I prescribed you an acid reducing medication called Prevacid that you will take once daily as a preventative medication.  Prevacid works best when taken daily.  Prevacid does not relieve acute heartburn symptoms which is why you need to take it every day for best results.  For your constipation, please begin taking Trulance once daily every morning.  This medication is not a laxative or a stimulant.  It pulls a significant amount of fluid into your colon significantly increasing the volume of your stool and making it easier to pass.  It does not cause abdominal cramping but it can produce a significant amount of stool so recommend that you take this medication on a daily plan to be home for several hours.  It also works best if you take it 30 minutes before your morning meal.  Please follow-up with your primary care provider regarding your visit today and these recommendations.  Thank you for visiting urgent care today.

## 2021-06-24 NOTE — ED Provider Notes (Signed)
[facility]    CSN: [id number] Arrival date & time: 06/24/21  1052    HISTORY   Chief Complaint  Patient presents with   Nausea    Along with dull pain in upper stomach - Entered by patient   Abdominal Pain   HPI Karen Martin is a 31 y.o. female. Patient presents to urgent care complaining of nausea without vomiting pain in her upper abdomen that began yesterday.  Patient endorses a history of constipation and acid reflux, states she takes over-the-counter Pepcid and Tums occasionally, states neither of these has been helping at this time.  Patient states she also believes she may be more constipated than usual.  Patient states that because she had the day off from work today, she decided to come get "checked out".  The history is provided by the patient.   History reviewed. No pertinent past medical history. Patient Active Problem List   Diagnosis Date Noted   Weight gain 01/16/2021   History reviewed. No pertinent surgical history. OB History   No obstetric history on file.    Home Medications    Prior to Admission medications   Medication Sig Start Date End Date Taking? Authorizing Provider  BIOTIN PO Take by mouth.    [provider]  Multiple Vitamins-Minerals (MULTIVITAMIN WOMEN PO) Take 2 each by mouth daily.    [provider]    Family History History reviewed. No pertinent family history. Social History Social History   Tobacco Use   Smoking status: Every Day    Packs/day: 1.00    Years: 8.00    Total pack years: 8.00    Types: Cigarettes   Smokeless tobacco: Never  Vaping Use   Vaping Use: Never used  Substance Use Topics   Alcohol use: Yes    Alcohol/week: 6.0 standard drinks of alcohol    Types: 6 Cans of beer per week    Comment: 5 times a week   Drug use: No   Allergies   Patient has no known allergies.  Review of Systems Review of Systems Pertinent findings noted in history of present illness.    Physical Exam Triage Vital Signs ED Triage Vitals  Enc Vitals Group     BP 11/08/20 0827 (!) 147/82     Pulse Rate 11/08/20 0827 72     Resp 11/08/20 0827 18     Temp 11/08/20 0827 98.3 F (36.8 C)     Temp Source 11/08/20 0827 Oral     SpO2 11/08/20 0827 98 %     Weight --      Height --      Head Circumference --      Peak Flow --      Pain Score 11/08/20 0826 5     Pain Loc --      Pain Edu? --      Excl. in GC? --   No data found.  Updated Vital Signs BP (!) 144/89 (BP Location: Right Arm)   Pulse (!) 59   Temp 98.4 F (36.9 C) (Oral)   Resp 16   LMP 06/03/2021 (Approximate)   SpO2 95%   Physical Exam Vitals and nursing note reviewed.  Constitutional:      General: She is not in acute distress.    Appearance: Normal appearance. She is not ill-appearing.  HENT:     Head: Normocephalic and atraumatic.  Eyes:     General: Lids are normal.  Right eye: No discharge.        Left eye: No discharge.     Extraocular Movements: Extraocular movements intact.     Conjunctiva/sclera: Conjunctivae normal.     Right eye: Right conjunctiva is not injected.     Left eye: Left conjunctiva is not injected.  Neck:     Trachea: Trachea and phonation normal.  Cardiovascular:     Rate and Rhythm: Normal rate and regular rhythm.     Pulses: Normal pulses.     Heart sounds: Normal heart sounds. No murmur heard.    No friction rub. No gallop.  Pulmonary:     Effort: Pulmonary effort is normal. No accessory muscle usage, prolonged expiration or respiratory distress.     Breath sounds: Normal breath sounds. No stridor, decreased air movement or transmitted upper airway sounds. No decreased breath sounds, wheezing, rhonchi or rales.  Chest:     Chest wall: No tenderness.  Abdominal:     General: Abdomen is flat. Bowel sounds are decreased. There is no distension.     Palpations: Abdomen is soft.     Tenderness: There is generalized abdominal tenderness. There is no right  CVA tenderness or left CVA tenderness.     Hernia: No hernia is present.  Musculoskeletal:        General: Normal range of motion.     Cervical back: Normal range of motion and neck supple. Normal range of motion.  Lymphadenopathy:     Cervical: No cervical adenopathy.  Skin:    General: Skin is warm and dry.     Findings: No erythema or rash.  Neurological:     General: No focal deficit present.     Mental Status: She is alert and oriented to person, place, and time.  Psychiatric:        Mood and Affect: Mood normal.        Behavior: Behavior normal.     Visual Acuity Right Eye Distance:   Left Eye Distance:   Bilateral Distance:    Right Eye Near:   Left Eye Near:    Bilateral Near:     UC Couse / Diagnostics / Procedures:    EKG  Radiology No results found.  Procedures Procedures (including critical care time)  UC Diagnoses / Final Clinical Impressions(s)   I have reviewed the triage vital signs and the nursing notes.  Pertinent labs & imaging results that were available during my care of the patient were reviewed by me and considered in my medical decision making (see chart for details).    Final diagnoses:  Nausea without vomiting  Chronic idiopathic constipation  Abdominal pain, epigastric   Moderate colonic stool burden.  Patient provided with Trulance.  Patient also provided with Prevacid for known heartburn symptoms not well relieved with OTCs.  Patient provided with prescription for Zofran for current intermittent nausea.  Patient advised to follow-up with PCP regarding GI symptoms.  Return precautions advised.  ED Prescriptions     Medication Sig Dispense Auth. Provider   Plecanatide (TRULANCE) 3 MG TABS Take 1 tablet by mouth in the morning. 30 tablet Theadora RamaMorgan, Ihor Meinzer Scales, PA-C   lansoprazole (PREVACID) 30 MG capsule Take 1 capsule (30 mg total) by mouth in the morning. 30 minutes prior to breakfast meal 30 capsule Theadora RamaMorgan, Carrigan Delafuente Scales, PA-C    ondansetron (ZOFRAN-ODT) 4 MG disintegrating tablet Take 1 tablet (4 mg total) by mouth every 8 (eight) hours as needed for nausea or vomiting. 20 tablet  Theadora Rama Scales, PA-C      PDMP not reviewed this encounter.  Pending results:  Labs Reviewed  URINE CULTURE  POCT URINALYSIS DIP (MANUAL ENTRY)    Medications Ordered in UC: Medications - No data to display  Disposition Upon Discharge:  Condition: stable for discharge home Home: take medications as prescribed; routine discharge instructions as discussed; follow up as advised.  Patient presented with an acute illness with associated systemic symptoms and significant discomfort requiring urgent management. In my opinion, this is a condition that a prudent lay person (someone who possesses an average knowledge of health and medicine) may potentially expect to result in complications if not addressed urgently such as respiratory distress, impairment of bodily function or dysfunction of bodily organs.   Routine symptom specific, illness specific and/or disease specific instructions were discussed with the patient and/or caregiver at length.   As such, the patient has been evaluated and assessed, work-up was performed and treatment was provided in alignment with urgent care protocols and evidence based medicine.  Patient/parent/caregiver has been advised that the patient may require follow up for further testing and treatment if the symptoms continue in spite of treatment, as clinically indicated and appropriate.  Patient/parent/caregiver has been advised to return to the [facility] or PCP if no better; to PCP or the Emergency Department if new signs and symptoms develop, or if the current signs or symptoms continue to change or worsen for further workup, evaluation and treatment as clinically indicated and appropriate  The patient will follow up with their current PCP if and as advised. If the patient does not currently have a PCP we will  assist them in obtaining one.   The patient may need specialty follow up if the symptoms continue, in spite of conservative treatment and management, for further workup, evaluation, consultation and treatment as clinically indicated and appropriate.   Patient/parent/caregiver verbalized understanding and agreement of plan as discussed.  All questions were addressed during visit.  Please see discharge instructions below for further details of plan.  Discharge Instructions:   Discharge Instructions      The urinalysis that we performed in the clinic today was unremarkable, urine culture will be performed to confirm that you are not experiencing a urinary tract infection at this time.  The x-ray of your abdomen revealed a significant amount of stool that is backed up.  I believe this is the cause of your nausea and discomfort at this time.  For nausea I have sent a prescription for Zofran that you can take every 8 hours as needed.  For your history of heartburn previously well controlled on over-the-counter medications, I prescribed you an acid reducing medication called Prevacid that you will take once daily as a preventative medication.  Prevacid works best when taken daily.  Prevacid does not relieve acute heartburn symptoms which is why you need to take it every day for best results.  For your constipation, please begin taking Trulance once daily every morning.  This medication is not a laxative or a stimulant.  It pulls a significant amount of fluid into your colon significantly increasing the volume of your stool and making it easier to pass.  It does not cause abdominal cramping but it can produce a significant amount of stool so recommend that you take this medication on a daily plan to be home for several hours.  It also works best if you take it 30 minutes before your morning meal.  Please follow-up with your primary  care provider regarding your visit today and these  recommendations.  Thank you for visiting urgent care today.      This office note has been dictated using Teaching laboratory technician.  Unfortunately, and despite my best efforts, this method of dictation can sometimes lead to occasional typographical or grammatical errors.  I apologize in advance if this occurs.     Theadora Rama Scales, PA-C 06/24/21 1147

## 2021-06-25 LAB — URINE CULTURE

## 2021-06-26 ENCOUNTER — Encounter: Payer: Self-pay | Admitting: Gastroenterology

## 2021-07-22 ENCOUNTER — Encounter: Payer: Self-pay | Admitting: Gastroenterology

## 2021-07-22 ENCOUNTER — Ambulatory Visit: Payer: BC Managed Care – PPO | Admitting: Gastroenterology

## 2021-07-22 VITALS — BP 110/80 | HR 60 | Ht 71.0 in | Wt 225.0 lb

## 2021-07-22 DIAGNOSIS — K5909 Other constipation: Secondary | ICD-10-CM | POA: Diagnosis not present

## 2021-07-22 MED ORDER — TRULANCE 3 MG PO TABS: 1.0000 | ORAL_TABLET | Freq: Every morning | ORAL | 5 refills | Status: DC

## 2021-07-22 NOTE — Progress Notes (Signed)
I agree with the above note, plan 

## 2021-07-22 NOTE — Patient Instructions (Signed)
If you are age 31 or younger, your body mass index should be between 19-25. Your Body mass index is 31.38 kg/m. If this is out of the aformentioned range listed, please consider follow up with your Primary Care Provider.  ________________________________________________________  The [place] [facility] providers would like to encourage you to use [facility] to communicate with providers for non-urgent requests or questions.  Due to long hold times on the telephone, sending your provider a message by [facility] may be a faster and more efficient way to get a response.  Please allow 48 business hours for a response.  Please remember that this is for non-urgent requests.  _______________________________________________________  Doug Sou, PA-C recommends that you complete a bowel purge (to clean out your bowels). Please do the following: Purchase a bottle of Miralax over the counter You will then drink 6-8 capfuls of Miralax mixed in an adequate amount of water/juice/gatorade (you may choose which of these liquids to drink) over the next 2-3 hours. You should expect results within 1 to 6 hours after completing the bowel purge.  The following day start Trulance 3 mg 1 tablet every morning.  Call the office or send a message to [place], Jessica's nurse to update on how you are doing.  Thank you for entrusting me with your care and choosing [facility].  Doug Sou, PA-C

## 2021-07-22 NOTE — Progress Notes (Signed)
07/22/2021 Karen Martin [id number] [birth year]   HISTORY OF PRESENT ILLNESS:  This is a 31 year old female with chronic constipation.  She states that this dates back to when she was in high school.  Had an anorectal manometry in 2009.  That showed "megarectum" and she was given bowel regimens.  Recent abdominal x-ray showed moderate stool throughout the colon.  Extensive laboratory work-up for weight gain, etc. of the past year has all been unremarkable.  No rectal bleeding.  Has bowel movements a few times a week that feel complete even without stool regimen.  Had success with samples of Trulance and was having daily bowel movements like she thinks she should have.  She says that she took the Trulance for 10 days and really felt like it helped.  Previous abdominal pain and nausea that she was experiencing has resolved.   History reviewed. No pertinent past medical history. Past Surgical History:  Procedure Laterality Date   WISDOM TOOTH EXTRACTION      reports that she has quit smoking. Her smoking use included cigarettes. She has never used smokeless tobacco. She reports that she does not currently use alcohol. She reports that she does not use drugs. family history includes Lung cancer in her paternal grandmother; Prostate cancer (age of onset: 1) in her father. No Known Allergies    Outpatient Encounter Medications as of 07/22/2021  Medication Sig   BIOTIN PO Take by mouth.   calcium carbonate (TITRALAC) 420 MG CHEW chewable tablet Chew 840 mg by mouth daily.   Multiple Vitamins-Minerals (MULTIVITAMIN WOMEN PO) Take 2 each by mouth daily.   Probiotic Product (PROBIOTIC-10) CHEW Chew 1 each by mouth daily.   lansoprazole (PREVACID) 30 MG capsule Take 1 capsule (30 mg total) by mouth in the morning. 30 minutes prior to breakfast meal (Patient not taking: Reported on 07/22/2021)   ondansetron (ZOFRAN-ODT) 4 MG disintegrating tablet Take 1 tablet (4 mg total) by mouth every 8  (eight) hours as needed for nausea or vomiting. (Patient not taking: Reported on 07/22/2021)   Plecanatide (TRULANCE) 3 MG TABS Take 1 tablet by mouth in the morning. (Patient not taking: Reported on 07/22/2021)   No facility-administered encounter medications on file as of 07/22/2021.     REVIEW OF SYSTEMS  : All other systems reviewed and negative except where noted in the History of Present Illness.   PHYSICAL EXAM: BP 110/80   Pulse 60   Ht 5\' 11"  (1.803 m)   Wt 225 lb (102.1 kg)   LMP 07/02/2021 (Approximate)   SpO2 97%   BMI 31.38 kg/m  General: Well developed white female in no acute distress Head: Normocephalic and atraumatic Eyes:  Sclerae anicteric, conjunctiva pink. Ears: Normal auditory acuity Lungs: Clear throughout to auscultation; no W/R/R. Heart: Regular rate and rhythm; no M/R/G. Abdomen: Soft, non-distended.  BS present.  Non-tender. Musculoskeletal: Symmetrical with no gross deformities  Skin: No lesions on visible extremities Extremities: No edema  Neurological: Alert oriented x 4, grossly non-focal Psychological:  Alert and cooperative. Normal mood and affect  ASSESSMENT AND PLAN: *31 year old female with chronic constipation.  She states that this dates back to when she was in high school.  Had an anorectal manometry in 2009.  That showed "megarectum" and she was given bowel regimens.  Recent abdominal x-ray showed moderate stool throughout the colon.  Extensive laboratory work-up for weight gain, etc. of the past year has all been unremarkable.  No rectal bleeding.  Has bowel movements  a few times a week that feel complete even without stool regimen.  Had success with samples of Trulance and was having daily bowel movements like she thinks she should have.  We can certainly prescribe the Trulance 3 mg daily.  Prescription sent to pharmacy.  She was instructed on a MiraLAX bowel purge as well and may do that one evening to completely clean out before restarting the  Trulance.  She will get back with [place] if for some reason there is insurance coverage issues on the Trulance and we can try Linzess or Amitiza, Motegrity, Ibsrela, or something else instead.  She will keep [place] updated on her results with the medication as well.   CC:  Deeann Saint, MD

## 2021-07-24 ENCOUNTER — Other Ambulatory Visit (HOSPITAL_COMMUNITY): Payer: Self-pay

## 2021-07-28 ENCOUNTER — Telehealth: Payer: Self-pay

## 2021-07-28 NOTE — Telephone Encounter (Signed)
Received a fax regarding Prior Authorization from [facility] for TRULANCE 3MG . Authorization has been DENIED because this insurance plan only covers CIC and IBS-C diagnosis codes.  , CPhT Pharmacy Patient Advocate Specialist [facility] Patient Advocate Team Phone: [phone]   Fax: [phone]

## 2021-07-31 ENCOUNTER — Telehealth: Payer: Self-pay | Admitting: Gastroenterology

## 2021-07-31 NOTE — Telephone Encounter (Signed)
This needs to go to the CMA that is with Shanda Bumps

## 2021-07-31 NOTE — Telephone Encounter (Signed)
Patient insurance requires PA for Trulance. Please submit

## 2021-07-31 NOTE — Telephone Encounter (Signed)
Inbound call from patient stating she is needing prior authorization for medication. Patient states insurance needs a urgent request and can be reached at [phone]. Please give a call to further advise.

## 2021-08-05 MED ORDER — LINACLOTIDE 145 MCG PO CAPS: 145.0000 ug | ORAL_CAPSULE | Freq: Every day | ORAL | 3 refills | Status: DC

## 2021-08-05 NOTE — Telephone Encounter (Signed)
Patient insurance denied Trulance. Please advise.

## 2021-08-05 NOTE — Telephone Encounter (Signed)
Sent script for Linzess 145 mcg daily to pharmacy.

## 2021-08-07 NOTE — Telephone Encounter (Signed)
Left message for patient to call office. Sample placed up front of Trulance.

## 2021-08-19 NOTE — Telephone Encounter (Signed)
Patient called office about PA on her Trulance. Phone call was disconnected before able to provide information. Left message for patient to call office.

## 2021-08-20 NOTE — Telephone Encounter (Signed)
Spoke with patient and informed her samples for Linzess have been placed up front as well as a coupon card.

## 2021-09-09 ENCOUNTER — Ambulatory Visit (INDEPENDENT_AMBULATORY_CARE_PROVIDER_SITE_OTHER): Payer: BC Managed Care – PPO | Admitting: Family Medicine

## 2021-09-09 ENCOUNTER — Encounter: Payer: Self-pay | Admitting: Family Medicine

## 2021-09-09 ENCOUNTER — Other Ambulatory Visit (HOSPITAL_COMMUNITY)
Admission: RE | Admit: 2021-09-09 | Discharge: 2021-09-09 | Disposition: A | Discharge status: Home / Self Care | Payer: BC Managed Care – PPO | Source: Ambulatory Visit | Attending: Family Medicine | Admitting: Family Medicine

## 2021-09-09 VITALS — BP 100/72 | HR 80 | Temp 98.3°F | Ht 71.0 in | Wt 214.3 lb

## 2021-09-09 DIAGNOSIS — Z9189 Other specified personal risk factors, not elsewhere classified: Secondary | ICD-10-CM | POA: Diagnosis not present

## 2021-09-09 NOTE — Progress Notes (Signed)
   Established Patient Office Visit  Subjective   Patient ID: Lisa-Marie Rueger, female    DOB: March 23, 1990  Age: 31 y.o. MRN: [id number]  Chief Complaint  Patient presents with   Unprotected Sex    Patient requests STD testing due to having unprotected sex 1 week ago, denies being told partner tested positive    Patient is here due to increased white vaginal discharge and she is reporting more cloudy dark urine. She reports no dysuria, no fever or chills, no pelvic pain, no increasing frequency. She reports she is having unprotected sex with her partner. Is concerned because she had a change in her usual vaginal discharge. No itching, no lesions.       Review of Systems  Constitutional:  Negative for chills and fever.  All other systems reviewed and are negative.     Objective:     BP 100/72 (BP Location: Left Arm, Patient Position: Sitting, Cuff Size: Large)   Pulse 80   Temp 98.3 F (36.8 C) (Oral)   Ht 5\' 11"  (1.803 m)   Wt 214 lb 4.8 oz (97.2 kg)   LMP 08/27/2021 (Exact Date)   SpO2 98%   BMI 29.89 kg/m    Physical Exam Exam conducted with a chaperone present.  Genitourinary:    General: Normal vulva.     Pubic Area: No rash.      Vagina: Vaginal discharge (whitish thin discharge) present.     Cervix: Normal.      No results found for any visits on 09/09/21.    The ASCVD Risk score (Arnett DK, et al., 2019) failed to calculate for the following reasons:   The 2019 ASCVD risk score is only valid for ages 38 to 91    Assessment & Plan:   Problem List Items Addressed This Visit   None Visit Diagnoses     At risk for sexually transmitted disease due to unprotected sex    -  Primary   Relevant Orders   Cervicovaginal ancillary only  Patient was concerned about her high risk of STI. Nuswab was performed and I will notify the patient of the results and call in any medications if needed at that time.      No follow-ups on file.    76, MD

## 2021-09-11 LAB — CERVICOVAGINAL ANCILLARY ONLY
Bacterial Vaginitis (gardnerella): NEGATIVE
Candida Glabrata: NEGATIVE
Candida Vaginitis: NEGATIVE
Chlamydia: NEGATIVE
Comment: NEGATIVE
Comment: NEGATIVE
Comment: NEGATIVE
Comment: NEGATIVE
Comment: NEGATIVE
Comment: NORMAL
Neisseria Gonorrhea: NEGATIVE
Trichomonas: NEGATIVE

## 2021-09-18 ENCOUNTER — Telehealth: Payer: Self-pay | Admitting: Gastroenterology

## 2021-09-18 NOTE — Telephone Encounter (Signed)
Patient informed Linzess 145 samples up front.

## 2021-09-18 NOTE — Telephone Encounter (Signed)
Pt called states she starts a new job Monday and doesn't have insurance until Oct.1. Would like to know if she can get samples of linzess or trulance. Please advise.

## 2021-11-24 ENCOUNTER — Telehealth: Payer: Self-pay

## 2021-11-24 ENCOUNTER — Ambulatory Visit (INDEPENDENT_AMBULATORY_CARE_PROVIDER_SITE_OTHER): Payer: 59

## 2021-11-24 ENCOUNTER — Ambulatory Visit
Admission: EM | Admit: 2021-11-24 | Discharge: 2021-11-24 | Disposition: A | Discharge status: Home / Self Care | Payer: 59

## 2021-11-24 DIAGNOSIS — R059 Cough, unspecified: Secondary | ICD-10-CM

## 2021-11-24 DIAGNOSIS — R0689 Other abnormalities of breathing: Secondary | ICD-10-CM | POA: Diagnosis not present

## 2021-11-24 DIAGNOSIS — J189 Pneumonia, unspecified organism: Secondary | ICD-10-CM | POA: Diagnosis not present

## 2021-11-24 LAB — POCT INFLUENZA A/B
Influenza A, POC: NEGATIVE
Influenza B, POC: NEGATIVE

## 2021-11-24 LAB — POCT RAPID STREP A (OFFICE): Rapid Strep A Screen: NEGATIVE

## 2021-11-24 MED ORDER — PROMETHAZINE-DM 6.25-15 MG/5ML PO SYRP: 5.0000 mL | ORAL_SOLUTION | Freq: Four times a day (QID) | ORAL | 0 refills | Status: DC | PRN

## 2021-11-24 MED ORDER — LIDOCAINE VISCOUS HCL 2 % MT SOLN: 15.0000 mL | OROMUCOSAL | 0 refills | Status: DC | PRN

## 2021-11-24 MED ORDER — IBUPROFEN 400 MG PO TABS: 400.0000 mg | ORAL_TABLET | Freq: Three times a day (TID) | ORAL | 0 refills | Status: DC | PRN

## 2021-11-24 MED ORDER — GUAIFENESIN 400 MG PO TABS: ORAL_TABLET | ORAL | 0 refills | Status: DC

## 2021-11-24 MED ORDER — CEFDINIR 300 MG PO CAPS: 300.0000 mg | ORAL_CAPSULE | Freq: Two times a day (BID) | ORAL | 0 refills | Status: AC

## 2021-11-24 NOTE — Telephone Encounter (Signed)
-  Cough, sore throat, hard to breathe and swallow  11/24/2021 12:48:58 PM Go to ED Now Mayford Knife, RN, Lupe Carney  Comments User: Hadley Pen, RN Date/Time Lamount Cohen Time): 11/24/2021 12:50:16 PM Advised to proceed to ED. Pt unsure of how she would get there. advised she could call EMS if needed. Will call mom for assistance.  Referrals GO TO FACILITY UNDECIDED  11/24/21 1642 - Pt states she feels the same. States she going to try to go to [facility]. Pt states "hard to breathe" she meant "winded" with activity.

## 2021-11-24 NOTE — ED Triage Notes (Signed)
Pt c/o productive cough, SOB sore throat, and chest pain (started today, tightness, centralized).  Started: Thursday   Home interventions: dayquil, nyquil, mucinex, advil

## 2021-11-24 NOTE — ED Provider Notes (Signed)
[facility]    CSN: [id number] Arrival date & time: 11/24/21  1752    HISTORY   Chief Complaint  Patient presents with   Cough    Entered by patient   Sore Throat   Shortness of Breath   Chest Pain   HPI Karen Martin is a pleasant, 31 y.o. female who presents to urgent care today. Patient complains of productive cough, shortness of breath with cough, sore throat without pain with swallowing, centralized chest pain that feels tight when coughing.  Patient states that symptoms began 5 days ago.  Patient states has been taking DayQuil, NyQuil, Mucinex and Advil without meaningful relief of her symptoms.  Patient denies history of allergies, asthma.  Patient denies history of chronic bronchitis.  Patient states she is a former cigarette smoker.  The history is provided by the patient.   History reviewed. No pertinent past medical history. Patient Active Problem List   Diagnosis Date Noted   Chronic constipation 07/22/2021   Weight gain 01/16/2021   Past Surgical History:  Procedure Laterality Date   WISDOM TOOTH EXTRACTION     OB History   No obstetric history on file.    Home Medications    Prior to Admission medications   Medication Sig Start Date End Date Taking? Authorizing Provider  escitalopram (LEXAPRO) 10 MG tablet Take 10 mg by mouth daily.   Yes [provider]  calcium carbonate (TITRALAC) 420 MG CHEW chewable tablet Chew 840 mg by mouth daily.    [provider]  linaclotide Rolan Lipa) 145 MCG CAPS capsule Take 1 capsule (145 mcg total) by mouth daily before breakfast. 08/05/21   Zehr, Laban Emperor, PA-C  Multiple Vitamins-Minerals (MULTIVITAMIN WOMEN PO) Take 2 each by mouth daily.    [provider]  Probiotic Product (PROBIOTIC-10) CHEW Chew 1 each by mouth daily.    [provider]    Family History Family History  Problem Relation Age of Onset   Prostate cancer Father 62   Lung cancer Paternal  Grandmother    Colon cancer Neg Hx    Esophageal cancer Neg Hx    Stomach cancer Neg Hx    Social History Social History   Tobacco Use   Smoking status: Former    Years: 8.00    Types: Cigarettes   Smokeless tobacco: Never  Vaping Use   Vaping Use: Never used  Substance Use Topics   Alcohol use: Not Currently    Comment: 3 x week   Drug use: No   Allergies   Patient has no known allergies.  Review of Systems Review of Systems Pertinent findings revealed after performing a 14 point review of systems has been noted in the history of present illness.  Physical Exam Triage Vital Signs ED Triage Vitals  Enc Vitals Group     BP 11/08/20 0827 (!) 147/82     Pulse Rate 11/08/20 0827 72     Resp 11/08/20 0827 18     Temp 11/08/20 0827 98.3 F (36.8 C)     Temp Source 11/08/20 0827 Oral     SpO2 11/08/20 0827 98 %     Weight --      Height --      Head Circumference --      Peak Flow --      Pain Score 11/08/20 0826 5     Pain Loc --      Pain Edu? --      Excl. in  GC? --   No data found.  Updated Vital Signs BP (!) 139/91 (BP Location: Right Arm)   Pulse 70   Temp 99.2 F (37.3 C) (Oral)   Resp 18   LMP 11/21/2021   SpO2 94%   Physical Exam Vitals and nursing note reviewed.  Constitutional:      General: She is not in acute distress.    Appearance: Normal appearance. She is not ill-appearing.  HENT:     Head: Normocephalic and atraumatic.     Salivary Glands: Right salivary gland is not diffusely enlarged or tender. Left salivary gland is not diffusely enlarged or tender.     Right Ear: Tympanic membrane, ear canal and external ear normal. No drainage. No middle ear effusion. There is no impacted cerumen. Tympanic membrane is not erythematous or bulging.     Left Ear: Tympanic membrane, ear canal and external ear normal. No drainage.  No middle ear effusion. There is no impacted cerumen. Tympanic membrane is not erythematous or bulging.     Nose: Nose normal.  No nasal deformity, septal deviation, mucosal edema, congestion or rhinorrhea.     Right Turbinates: Not enlarged, swollen or pale.     Left Turbinates: Not enlarged, swollen or pale.     Right Sinus: No maxillary sinus tenderness or frontal sinus tenderness.     Left Sinus: No maxillary sinus tenderness or frontal sinus tenderness.     Mouth/Throat:     Lips: Pink. No lesions.     Mouth: Mucous membranes are moist. No oral lesions.     Pharynx: Oropharynx is clear. Uvula midline. No posterior oropharyngeal erythema or uvula swelling.     Tonsils: No tonsillar exudate. 0 on the right. 0 on the left.  Eyes:     General: Lids are normal.        Right eye: No discharge.        Left eye: No discharge.     Extraocular Movements: Extraocular movements intact.     Conjunctiva/sclera: Conjunctivae normal.     Right eye: Right conjunctiva is not injected.     Left eye: Left conjunctiva is not injected.  Neck:     Trachea: Trachea and phonation normal.  Cardiovascular:     Rate and Rhythm: Normal rate and regular rhythm.     Pulses: Normal pulses.     Heart sounds: Normal heart sounds. No murmur heard.    No friction rub. No gallop.  Pulmonary:     Effort: Pulmonary effort is normal. No accessory muscle usage, prolonged expiration or respiratory distress.     Breath sounds: No stridor, decreased air movement or transmitted upper airway sounds. Examination of the left-middle field reveals rales. Examination of the left-lower field reveals rales. Rales present. No decreased breath sounds, wheezing or rhonchi.  Chest:     Chest wall: No tenderness.  Musculoskeletal:        General: Normal range of motion.     Cervical back: Full passive range of motion without pain, normal range of motion and neck supple. Normal range of motion.  Lymphadenopathy:     Cervical: Cervical adenopathy present.     Right cervical: Superficial cervical adenopathy and posterior cervical adenopathy present.     Left  cervical: Superficial cervical adenopathy and posterior cervical adenopathy present.  Skin:    General: Skin is warm and dry.     Findings: No erythema or rash.  Neurological:     General: No focal deficit present.  Mental Status: She is alert and oriented to person, place, and time.  Psychiatric:        Mood and Affect: Mood normal.        Behavior: Behavior normal.     Visual Acuity Right Eye Distance:   Left Eye Distance:   Bilateral Distance:    Right Eye Near:   Left Eye Near:    Bilateral Near:     [facility] Couse / Diagnostics / Procedures:     Radiology DG Chest 2 View  Result Date: 11/24/2021 CLINICAL DATA:  Decreased breath sounds on the right. Persistent cough productive of yellow mucus. Fever. EXAM: CHEST - 2 VIEW COMPARISON:  None Available. FINDINGS: Cardiac silhouette and mediastinal contours are within normal limits. The right lung is clear. Very subtle opacification of the inferior left lung on frontal view without definite correlate on lateral view. No pleural effusion or pneumothorax. No acute skeletal abnormality. Bilateral nipple piercings are noted. IMPRESSION: Very subtle opacification of the inferior left lung on frontal view without definite correlate on lateral view. This could represent atelectasis or early pneumonia. Electronically Signed   By: Neita Garnet M.D.   On: 11/24/2021 20:18    Procedures Procedures (including critical care time) EKG  Pending results:  Labs Reviewed  POCT INFLUENZA A/B  POCT RAPID STREP A (OFFICE)    Medications Ordered in UC: Medications - No data to display  UC Diagnoses / Final Clinical Impressions(s)   I have reviewed the triage vital signs and the nursing notes.  Pertinent labs & imaging results that were available during my care of the patient were reviewed by me and considered in my medical decision making (see chart for details).    Final diagnoses:  Community acquired pneumonia of left lower lobe of lung    Patient advised to physical exam and chest x-ray findings.  Patient provided with a prescription for 7 days of cefdinir due to patient reported intolerance of amoxicillin.  Patient advised to take guaifenesin during the daytime to promote sputum production and to take Promethazine DM at nighttime to help her get some sleep.  Recommend continue ibuprofen for pain.  Patient stating she would like something topical for her throat pain, viscous lidocaine provided to swish and spit as needed.  Patient advised to return in 3 to 4 days if cough is not improving.  ED Prescriptions     Medication Sig Dispense Auth. Provider   cefdinir (OMNICEF) 300 MG capsule Take 1 capsule (300 mg total) by mouth 2 (two) times daily for 7 days. 14 capsule Theadora Rama Scales, PA-C   guaifenesin (HUMIBID E) 400 MG TABS tablet Take 1 tablet 3 times daily as needed for chest congestion and cough 21 tablet Theadora Rama Scales, PA-C   promethazine-dextromethorphan (PROMETHAZINE-DM) 6.25-15 MG/5ML syrup Take 5 mLs by mouth 4 (four) times daily as needed for cough. 118 mL Theadora Rama Scales, PA-C   ibuprofen (ADVIL) 400 MG tablet Take 1 tablet (400 mg total) by mouth every 8 (eight) hours as needed for up to 30 doses. 30 tablet Theadora Rama Scales, PA-C   lidocaine (XYLOCAINE) 2 % solution Use as directed 15 mLs in the mouth or throat every 3 (three) hours as needed for mouth pain (Sore throat). 300 mL Theadora Rama Scales, PA-C      PDMP not reviewed this encounter.  Disposition Upon Discharge:  Condition: stable for discharge home Home: take medications as prescribed; routine discharge instructions as discussed; follow up as advised.  Patient presented with an acute illness with associated systemic symptoms and significant discomfort requiring urgent management. In my opinion, this is a condition that a prudent lay person (someone who possesses an average knowledge of health and medicine) may potentially  expect to result in complications if not addressed urgently such as respiratory distress, impairment of bodily function or dysfunction of bodily organs.   Routine symptom specific, illness specific and/or disease specific instructions were discussed with the patient and/or caregiver at length.   As such, the patient has been evaluated and assessed, work-up was performed and treatment was provided in alignment with urgent care protocols and evidence based medicine.  Patient/parent/caregiver has been advised that the patient may require follow up for further testing and treatment if the symptoms continue in spite of treatment, as clinically indicated and appropriate.  If the patient was tested for COVID-19, Influenza and/or RSV, then the patient/parent/guardian was advised to isolate at home pending the results of his/her diagnostic coronavirus test and potentially longer if they're positive. I have also advised pt that if his/her COVID-19 test returns positive, it's recommended to self-isolate for at least 10 days after symptoms first appeared AND until fever-free for 24 hours without fever reducer AND other symptoms have improved or resolved. Discussed self-isolation recommendations as well as instructions for household member/close contacts as per the [facility] and [place] DHHS, and also gave patient the [place] packet with this information.  Patient/parent/caregiver has been advised to return to the [facility] or PCP in 3-5 days if no better; to PCP or the Emergency Department if new signs and symptoms develop, or if the current signs or symptoms continue to change or worsen for further workup, evaluation and treatment as clinically indicated and appropriate  The patient will follow up with their current PCP if and as advised. If the patient does not currently have a PCP we will assist them in obtaining one.   The patient may need specialty follow up if the symptoms continue, in spite of conservative treatment and  management, for further workup, evaluation, consultation and treatment as clinically indicated and appropriate.  Patient/parent/caregiver verbalized understanding and agreement of plan as discussed.  All questions were addressed during visit.  Please see discharge instructions below for further details of plan.  Discharge Instructions:   Discharge Instructions      Your strep test today is negative.  Streptococcal throat culture will be performed per our protocol.  The result of your throat culture will be posted to your MyChart once it is complete, this typically takes 3 to 5 days.  If your streptococcal throat culture is positive, you will be contacted by phone and antibiotics will prescribed for you for an additional 3 days.   Your rapid influenza antigen test today was negative.  Due to the duration of your symptoms, you would no longer benefit from antiviral therapy for influenza.  No further testing is indicated.    Your chest x-ray is concerning for pneumonia in your left lower lobe.  I believe that this is the source of your productive cough cough and your intermittently feeling hot and cold.    Please read below to learn more about the medications, dosages and frequencies that I recommend to help alleviate your symptoms and to get you feeling better soon:   Omnicef (cefdinir):  1 capsule twice daily for 7 days, you can take it with or without food.  This antibiotic can cause upset stomach, this will resolve once antibiotics are complete.  You  are welcome to use a probiotic, eat yogurt, take Imodium while taking this medication.  Please avoid other systemic medications such as Maalox, Pepto-Bismol or milk of magnesia as they can interfere with your body's ability to absorb the antibiotics.  I have sent a prescription for this medication to your pharmacy.       Advil, Motrin (ibuprofen): This is a good anti-inflammatory medication which addresses aches, pains and inflammation of the upper  airways that causes sinus and nasal congestion as well as in the lower airways which makes your cough feel tight and sometimes burn.  I recommend that you take between 400 to 600 mg every 6-8 hours as needed.      Robitussin, Mucinex (guaifenesin): This is an expectorant.  This helps break up chest congestion and loosen up thick nasal drainage making phlegm and drainage more liquid and therefore easier to remove.  I recommend being 400 mg three times daily as needed.  I have sent a prescription for this medication to your pharmacy.   Promethazine DM: Promethazine is both a nasal decongestant and an antinausea medication that makes most patients feel fairly sleepy.  The DM is dextromethorphan, a cough suppressant found in many over-the-counter cough medications.  Please take 5 mL before bedtime to minimize your cough which will help you sleep better.  I have sent a prescription for this medication to your pharmacy.   Xylocaine (lidocaine): This is a numbing medication that can be swished for 15 seconds and swallowed.  You can use this every 3 hours while awake to relieve pain in your mouth and throat.  I have sent a prescription for this medication to your pharmacy.   Please follow-up within the next 5-7 days either with your primary care provider or urgent care if your symptoms do not resolve.  If you do not have a primary care provider, we will assist you in finding one.        Thank you for visiting urgent care today.  We appreciate the opportunity to participate in your care.         This office note has been dictated using Museum/gallery curator.  Unfortunately, this method of dictation can sometimes lead to typographical or grammatical errors.  I apologize for your inconvenience in advance if this occurs.  Please do not hesitate to reach out to me if clarification is needed.      Lynden Oxford Scales, PA-C 11/27/21 1215

## 2021-11-24 NOTE — Discharge Instructions (Signed)
Your strep test today is negative.  Streptococcal throat culture will be performed per our protocol.  The result of your throat culture will be posted to your MyChart once it is complete, this typically takes 3 to 5 days.  If your streptococcal throat culture is positive, you will be contacted by phone and antibiotics will prescribed for you for an additional 3 days.   Your rapid influenza antigen test today was negative.  Due to the duration of your symptoms, you would no longer benefit from antiviral therapy for influenza.  No further testing is indicated.    Your chest x-ray is concerning for pneumonia in your left lower lobe.  I believe that this is the source of your productive cough cough and your intermittently feeling hot and cold.    Please read below to learn more about the medications, dosages and frequencies that I recommend to help alleviate your symptoms and to get you feeling better soon:   Omnicef (cefdinir):  1 capsule twice daily for 7 days, you can take it with or without food.  This antibiotic can cause upset stomach, this will resolve once antibiotics are complete.  You are welcome to use a probiotic, eat yogurt, take Imodium while taking this medication.  Please avoid other systemic medications such as Maalox, Pepto-Bismol or milk of magnesia as they can interfere with your body's ability to absorb the antibiotics.  I have sent a prescription for this medication to your pharmacy.       Advil, Motrin (ibuprofen): This is a good anti-inflammatory medication which addresses aches, pains and inflammation of the upper airways that causes sinus and nasal congestion as well as in the lower airways which makes your cough feel tight and sometimes burn.  I recommend that you take between 400 to 600 mg every 6-8 hours as needed.      Robitussin, Mucinex (guaifenesin): This is an expectorant.  This helps break up chest congestion and loosen up thick nasal drainage making phlegm and drainage  more liquid and therefore easier to remove.  I recommend being 400 mg three times daily as needed.  I have sent a prescription for this medication to your pharmacy.   Promethazine DM: Promethazine is both a nasal decongestant and an antinausea medication that makes most patients feel fairly sleepy.  The DM is dextromethorphan, a cough suppressant found in many over-the-counter cough medications.  Please take 5 mL before bedtime to minimize your cough which will help you sleep better.  I have sent a prescription for this medication to your pharmacy.   Xylocaine (lidocaine): This is a numbing medication that can be swished for 15 seconds and swallowed.  You can use this every 3 hours while awake to relieve pain in your mouth and throat.  I have sent a prescription for this medication to your pharmacy.   Please follow-up within the next 5-7 days either with your primary care provider or urgent care if your symptoms do not resolve.  If you do not have a primary care provider, we will assist you in finding one.        Thank you for visiting urgent care today.  We appreciate the opportunity to participate in your care.

## 2021-12-17 DIAGNOSIS — F4323 Adjustment disorder with mixed anxiety and depressed mood: Secondary | ICD-10-CM | POA: Diagnosis not present

## 2021-12-24 DIAGNOSIS — F4323 Adjustment disorder with mixed anxiety and depressed mood: Secondary | ICD-10-CM | POA: Diagnosis not present

## 2021-12-31 DIAGNOSIS — F4323 Adjustment disorder with mixed anxiety and depressed mood: Secondary | ICD-10-CM | POA: Diagnosis not present

## 2022-01-07 ENCOUNTER — Ambulatory Visit
Admission: EM | Admit: 2022-01-07 | Discharge: 2022-01-07 | Disposition: A | Discharge status: Home / Self Care | Payer: BC Managed Care – PPO | Attending: Urgent Care | Admitting: Urgent Care

## 2022-01-07 DIAGNOSIS — J020 Streptococcal pharyngitis: Secondary | ICD-10-CM | POA: Diagnosis not present

## 2022-01-07 LAB — POCT RAPID STREP A (OFFICE): Rapid Strep A Screen: POSITIVE — AB

## 2022-01-07 MED ORDER — AMOXICILLIN 500 MG PO CAPS: 500.0000 mg | ORAL_CAPSULE | Freq: Two times a day (BID) | ORAL | 0 refills | Status: DC

## 2022-01-07 MED ORDER — IBUPROFEN 600 MG PO TABS: 600.0000 mg | ORAL_TABLET | Freq: Four times a day (QID) | ORAL | 0 refills | Status: DC | PRN

## 2022-01-07 NOTE — ED Triage Notes (Signed)
Pt c/o sore throat and red spots on the back of her throat. Pt reports it is hard to swallow due to the pain.

## 2022-01-07 NOTE — ED Provider Notes (Signed)
[facility]  Note:  This document was prepared using Conservation officer, historic buildings and may include unintentional dictation errors.  MRN: [id number] DOB: November 24, 1990  Subjective:   Karen Martin is a 31 y.o. female presenting for 5 day history of throat pain, painful swallowing, white spots. No cough, chest pain, shob.   No current facility-administered medications for this encounter.  Current Outpatient Medications:    calcium carbonate (TITRALAC) 420 MG CHEW chewable tablet, Chew 840 mg by mouth daily., Disp: , Rfl:    escitalopram (LEXAPRO) 10 MG tablet, Take 10 mg by mouth daily., Disp: , Rfl:    guaifenesin (HUMIBID E) 400 MG TABS tablet, Take 1 tablet 3 times daily as needed for chest congestion and cough, Disp: 21 tablet, Rfl: 0   ibuprofen (ADVIL) 400 MG tablet, Take 1 tablet (400 mg total) by mouth every 8 (eight) hours as needed for up to 30 doses., Disp: 30 tablet, Rfl: 0   lidocaine (XYLOCAINE) 2 % solution, Use as directed 15 mLs in the mouth or throat every 3 (three) hours as needed for mouth pain (Sore throat)., Disp: 300 mL, Rfl: 0   Multiple Vitamins-Minerals (MULTIVITAMIN WOMEN PO), Take 2 each by mouth daily., Disp: , Rfl:    Probiotic Product (PROBIOTIC-10) CHEW, Chew 1 each by mouth daily., Disp: , Rfl:    promethazine-dextromethorphan (PROMETHAZINE-DM) 6.25-15 MG/5ML syrup, Take 5 mLs by mouth 4 (four) times daily as needed for cough., Disp: 118 mL, Rfl: 0   No Known Allergies  History reviewed. No pertinent past medical history.   Past Surgical History:  Procedure Laterality Date   WISDOM TOOTH EXTRACTION      Family History  Problem Relation Age of Onset   Prostate cancer Father 71   Lung cancer Paternal Grandmother    Colon cancer Neg Hx    Esophageal cancer Neg Hx    Stomach cancer Neg Hx     Social History   Tobacco Use   Smoking status: Former    Years: 8.00    Types: Cigarettes   Smokeless tobacco: Never   Vaping Use   Vaping Use: Never used  Substance Use Topics   Alcohol use: Not Currently    Comment: 3 x week   Drug use: No    ROS   Objective:   Vitals: BP 127/87 (BP Location: Left Arm)   Pulse 71   Temp 98.4 F (36.9 C) (Oral)   Resp 18   LMP 12/26/2021 (Approximate)   SpO2 94%   Physical Exam Constitutional:      General: She is not in acute distress.    Appearance: Normal appearance. She is well-developed. She is not ill-appearing, toxic-appearing or diaphoretic.  HENT:     Head: Normocephalic and atraumatic.     Nose: Nose normal.     Mouth/Throat:     Mouth: Mucous membranes are moist.     Pharynx: Pharyngeal swelling, oropharyngeal exudate and posterior oropharyngeal erythema present. No uvula swelling.     Tonsils: Tonsillar exudate present. No tonsillar abscesses. 1+ on the right. 1+ on the left.  Eyes:     General: No scleral icterus.       Right eye: No discharge.        Left eye: No discharge.     Extraocular Movements: Extraocular movements intact.  Cardiovascular:     Rate and Rhythm: Normal rate.  Pulmonary:     Effort: Pulmonary effort is normal.  Skin:  General: Skin is warm and dry.  Neurological:     General: No focal deficit present.     Mental Status: She is alert and oriented to person, place, and time.  Psychiatric:        Mood and Affect: Mood normal.        Behavior: Behavior normal.     Results for orders placed or performed during the hospital encounter of 01/07/22 (from the past 24 hour(s))  POCT rapid strep A     Status: Abnormal   Collection Time: 01/07/22  4:22 PM  Result Value Ref Range   Rapid Strep A Screen Positive (A) Negative    Assessment and Plan :   PDMP not reviewed this encounter.  1. Strep pharyngitis     Will treat for strep pharyngitis.  Patient is to start amoxicillin, use supportive care otherwise. Counseled patient on potential for adverse effects with medications prescribed/recommended today, ER  and return-to-clinic precautions discussed, patient verbalized understanding.    Wallis Bamberg, [place] 01/07/22 1631

## 2022-01-14 DIAGNOSIS — F4323 Adjustment disorder with mixed anxiety and depressed mood: Secondary | ICD-10-CM | POA: Diagnosis not present

## 2022-01-16 ENCOUNTER — Ambulatory Visit: Payer: Self-pay

## 2022-01-16 ENCOUNTER — Ambulatory Visit
Admission: EM | Admit: 2022-01-16 | Discharge: 2022-01-16 | Disposition: A | Discharge status: Home / Self Care | Payer: BC Managed Care – PPO | Attending: Urgent Care | Admitting: Urgent Care

## 2022-01-16 DIAGNOSIS — L292 Pruritus vulvae: Secondary | ICD-10-CM | POA: Diagnosis not present

## 2022-01-16 DIAGNOSIS — J020 Streptococcal pharyngitis: Secondary | ICD-10-CM | POA: Diagnosis not present

## 2022-01-16 DIAGNOSIS — B3731 Acute candidiasis of vulva and vagina: Secondary | ICD-10-CM | POA: Diagnosis not present

## 2022-01-16 DIAGNOSIS — N760 Acute vaginitis: Secondary | ICD-10-CM | POA: Insufficient documentation

## 2022-01-16 DIAGNOSIS — B9689 Other specified bacterial agents as the cause of diseases classified elsewhere: Secondary | ICD-10-CM | POA: Insufficient documentation

## 2022-01-16 DIAGNOSIS — N898 Other specified noninflammatory disorders of vagina: Secondary | ICD-10-CM | POA: Insufficient documentation

## 2022-01-16 LAB — POCT URINE PREGNANCY: Preg Test, Ur: NEGATIVE

## 2022-01-16 MED ORDER — FLUCONAZOLE 150 MG PO TABS: 150.0000 mg | ORAL_TABLET | ORAL | 0 refills | Status: DC

## 2022-01-16 NOTE — ED Provider Notes (Signed)
[facility]  Note:  This document was prepared using Systems analyst and may include unintentional dictation errors.  MRN: [id number] DOB: 08-29-1990  Subjective:   Karen Martin is a 32 y.o. female presenting for 2-day history of acute onset persistent vaginal itching.  Tried Monistat but now feels worse. Denies fever, n/v, abdominal pain, pelvic pain, rashes, dysuria, urinary frequency, hematuria, vaginal discharge.  Of note patient did take an antibiotic course due to a positive rapid strep test from 01/07/2022. Also reports a very small painful bump under the tongue.   No current facility-administered medications for this encounter.  Current Outpatient Medications:    amoxicillin (AMOXIL) 500 MG capsule, Take 1 capsule (500 mg total) by mouth 2 (two) times daily., Disp: 20 capsule, Rfl: 0   calcium carbonate (TITRALAC) 420 MG CHEW chewable tablet, Chew 840 mg by mouth daily., Disp: , Rfl:    escitalopram (LEXAPRO) 10 MG tablet, Take 10 mg by mouth daily., Disp: , Rfl:    guaifenesin (HUMIBID E) 400 MG TABS tablet, Take 1 tablet 3 times daily as needed for chest congestion and cough, Disp: 21 tablet, Rfl: 0   ibuprofen (ADVIL) 600 MG tablet, Take 1 tablet (600 mg total) by mouth every 6 (six) hours as needed., Disp: 30 tablet, Rfl: 0   lidocaine (XYLOCAINE) 2 % solution, Use as directed 15 mLs in the mouth or throat every 3 (three) hours as needed for mouth pain (Sore throat)., Disp: 300 mL, Rfl: 0   Multiple Vitamins-Minerals (MULTIVITAMIN WOMEN PO), Take 2 each by mouth daily., Disp: , Rfl:    Probiotic Product (PROBIOTIC-10) CHEW, Chew 1 each by mouth daily., Disp: , Rfl:    promethazine-dextromethorphan (PROMETHAZINE-DM) 6.25-15 MG/5ML syrup, Take 5 mLs by mouth 4 (four) times daily as needed for cough., Disp: 118 mL, Rfl: 0   No Known Allergies  History reviewed. No pertinent past medical history.   Past Surgical History:  Procedure  Laterality Date   WISDOM TOOTH EXTRACTION      Family History  Problem Relation Age of Onset   Prostate cancer Father 69   Lung cancer Paternal Grandmother    Colon cancer Neg Hx    Esophageal cancer Neg Hx    Stomach cancer Neg Hx     Social History   Tobacco Use   Smoking status: Some Days    Types: Cigarettes   Smokeless tobacco: Never  Vaping Use   Vaping Use: Never used  Substance Use Topics   Alcohol use: Yes    Comment: weekly   Drug use: No    ROS   Objective:   Vitals: BP 132/89 (BP Location: Right Arm)   Pulse 92   Temp 98 F (36.7 C) (Oral)   Resp 16   LMP 12/26/2021 (Approximate)   SpO2 96%   Physical Exam Constitutional:      General: She is not in acute distress.    Appearance: Normal appearance. She is well-developed. She is not ill-appearing, toxic-appearing or diaphoretic.  HENT:     Head: Normocephalic and atraumatic.     Nose: Nose normal.     Mouth/Throat:     Mouth: Mucous membranes are moist.     Pharynx: No pharyngeal swelling, oropharyngeal exudate, posterior oropharyngeal erythema or uvula swelling.     Tonsils: No tonsillar exudate or tonsillar abscesses. 0 on the right. 0 on the left.   Eyes:     General: No scleral icterus.  Right eye: No discharge.        Left eye: No discharge.     Extraocular Movements: Extraocular movements intact.     Conjunctiva/sclera: Conjunctivae normal.  Cardiovascular:     Rate and Rhythm: Normal rate.  Pulmonary:     Effort: Pulmonary effort is normal.  Abdominal:     General: Bowel sounds are normal. There is no distension.     Palpations: Abdomen is soft. There is no mass.     Tenderness: There is no abdominal tenderness. There is no right CVA tenderness, left CVA tenderness, guarding or rebound.  Skin:    General: Skin is warm and dry.  Neurological:     General: No focal deficit present.     Mental Status: She is alert and oriented to person, place, and time.  Psychiatric:         Mood and Affect: Mood normal.        Behavior: Behavior normal.        Thought Content: Thought content normal.        Judgment: Judgment normal.    Results for orders placed or performed during the hospital encounter of 01/16/22 (from the past 24 hour(s))  POCT urine pregnancy     Status: None   Collection Time: 01/16/22  4:23 PM  Result Value Ref Range   Preg Test, Ur Negative Negative    Assessment and Plan :   PDMP not reviewed this encounter.  1. Acute vaginitis   2. Vaginal itching   3. Strep pharyngitis     Recommended monitor the oral lesion.  Patient is in agreement that she does not want to do any other medication besides the antibiotic and oral fluconazole to address a secondary yeast vaginitis.  Labs pending, will treat as appropriate otherwise.  Counseled patient on potential for adverse effects with medications prescribed/recommended today, ER and return-to-clinic precautions discussed, patient verbalized understanding.    Jaynee Eagles, [place] 01/16/22 [id number]

## 2022-01-16 NOTE — ED Triage Notes (Addendum)
Pt c/o vaginal itching x 2 days-used monostat/feels itching is worse after use-also c/o bump on tongue x 3 days-NAD-steady gait

## 2022-01-19 ENCOUNTER — Telehealth (HOSPITAL_COMMUNITY): Payer: Self-pay | Admitting: Emergency Medicine

## 2022-01-19 LAB — CERVICOVAGINAL ANCILLARY ONLY
Bacterial Vaginitis (gardnerella): POSITIVE — AB
Candida Glabrata: NEGATIVE
Candida Vaginitis: POSITIVE — AB
Chlamydia: NEGATIVE
Comment: NEGATIVE
Comment: NEGATIVE
Comment: NEGATIVE
Comment: NEGATIVE
Comment: NEGATIVE
Comment: NORMAL
Neisseria Gonorrhea: NEGATIVE
Trichomonas: NEGATIVE

## 2022-01-19 MED ORDER — METRONIDAZOLE 500 MG PO TABS: 500.0000 mg | ORAL_TABLET | Freq: Two times a day (BID) | ORAL | 0 refills | Status: DC

## 2022-01-21 DIAGNOSIS — F4323 Adjustment disorder with mixed anxiety and depressed mood: Secondary | ICD-10-CM | POA: Diagnosis not present

## 2022-01-28 DIAGNOSIS — F4323 Adjustment disorder with mixed anxiety and depressed mood: Secondary | ICD-10-CM | POA: Diagnosis not present

## 2022-02-11 DIAGNOSIS — F4323 Adjustment disorder with mixed anxiety and depressed mood: Secondary | ICD-10-CM | POA: Diagnosis not present

## 2022-02-18 DIAGNOSIS — F4323 Adjustment disorder with mixed anxiety and depressed mood: Secondary | ICD-10-CM | POA: Diagnosis not present

## 2022-03-11 DIAGNOSIS — F4323 Adjustment disorder with mixed anxiety and depressed mood: Secondary | ICD-10-CM | POA: Diagnosis not present

## 2022-03-17 DIAGNOSIS — F4323 Adjustment disorder with mixed anxiety and depressed mood: Secondary | ICD-10-CM | POA: Diagnosis not present

## 2022-03-24 DIAGNOSIS — F4323 Adjustment disorder with mixed anxiety and depressed mood: Secondary | ICD-10-CM | POA: Diagnosis not present

## 2022-04-03 ENCOUNTER — Encounter: Payer: BC Managed Care – PPO | Admitting: Family Medicine

## 2022-04-06 ENCOUNTER — Ambulatory Visit (INDEPENDENT_AMBULATORY_CARE_PROVIDER_SITE_OTHER): Payer: BC Managed Care – PPO

## 2022-04-06 ENCOUNTER — Encounter: Payer: Self-pay | Admitting: Family Medicine

## 2022-04-06 ENCOUNTER — Ambulatory Visit (INDEPENDENT_AMBULATORY_CARE_PROVIDER_SITE_OTHER): Payer: BC Managed Care – PPO | Admitting: Family Medicine

## 2022-04-06 VITALS — BP 130/88 | HR 99 | Temp 98.9°F | Ht 72.0 in | Wt 183.8 lb

## 2022-04-06 DIAGNOSIS — M545 Low back pain, unspecified: Secondary | ICD-10-CM

## 2022-04-06 DIAGNOSIS — K5909 Other constipation: Secondary | ICD-10-CM

## 2022-04-06 DIAGNOSIS — R233 Spontaneous ecchymoses: Secondary | ICD-10-CM | POA: Diagnosis not present

## 2022-04-06 DIAGNOSIS — Z0000 Encounter for general adult medical examination without abnormal findings: Secondary | ICD-10-CM | POA: Diagnosis not present

## 2022-04-06 DIAGNOSIS — H02843 Edema of right eye, unspecified eyelid: Secondary | ICD-10-CM | POA: Diagnosis not present

## 2022-04-06 LAB — CBC WITH DIFFERENTIAL/PLATELET
Basophils Absolute: 0 10*3/uL (ref 0.0–0.1)
Basophils Relative: 0.5 % (ref 0.0–3.0)
Eosinophils Absolute: 0 10*3/uL (ref 0.0–0.7)
Eosinophils Relative: 0.6 % (ref 0.0–5.0)
HCT: 40 % (ref 36.0–46.0)
Hemoglobin: 13.5 g/dL (ref 12.0–15.0)
Lymphocytes Relative: 16.4 % (ref 12.0–46.0)
Lymphs Abs: 0.9 10*3/uL (ref 0.7–4.0)
MCHC: 33.7 g/dL (ref 30.0–36.0)
MCV: 99.4 fl (ref 78.0–100.0)
Monocytes Absolute: 0.5 10*3/uL (ref 0.1–1.0)
Monocytes Relative: 8.8 % (ref 3.0–12.0)
Neutro Abs: 4.3 10*3/uL (ref 1.4–7.7)
Neutrophils Relative %: 73.7 % (ref 43.0–77.0)
Platelets: 232 10*3/uL (ref 150.0–400.0)
RBC: 4.02 Mil/uL (ref 3.87–5.11)
RDW: 13.2 % (ref 11.5–15.5)
WBC: 5.8 10*3/uL (ref 4.0–10.5)

## 2022-04-06 LAB — LIPID PANEL
Cholesterol: 204 mg/dL — ABNORMAL HIGH (ref 0–200)
HDL: 106.3 mg/dL (ref >39.00)
LDL Cholesterol: 85 mg/dL (ref 0–99)
NonHDL: 97.95
Total CHOL/HDL Ratio: 2
Triglycerides: 64 mg/dL (ref 0.0–149.0)
VLDL: 12.8 mg/dL (ref 0.0–40.0)

## 2022-04-06 LAB — COMPREHENSIVE METABOLIC PANEL
ALT: 42 U/L — ABNORMAL HIGH (ref 0–35)
AST: 36 U/L (ref 0–37)
Albumin: 4.2 g/dL (ref 3.5–5.2)
Alkaline Phosphatase: 33 U/L — ABNORMAL LOW (ref 39–117)
BUN: 6 mg/dL (ref 6–23)
CO2: 26 mEq/L (ref 19–32)
Calcium: 9.3 mg/dL (ref 8.4–10.5)
Chloride: 103 mEq/L (ref 96–112)
Creatinine, Ser: 0.63 mg/dL (ref 0.40–1.20)
GFR: 118.24 mL/min (ref >60.00)
Glucose, Bld: 87 mg/dL (ref 70–99)
Potassium: 3.9 mEq/L (ref 3.5–5.1)
Sodium: 139 mEq/L (ref 135–145)
Total Bilirubin: 0.6 mg/dL (ref 0.2–1.2)
Total Protein: 7.1 g/dL (ref 6.0–8.3)

## 2022-04-06 LAB — HEMOGLOBIN A1C: Hgb A1c MFr Bld: 5.1 % (ref 4.6–6.5)

## 2022-04-06 MED ORDER — LINACLOTIDE 145 MCG PO CAPS: 145.0000 ug | ORAL_CAPSULE | Freq: Every day | ORAL | 3 refills | Status: DC

## 2022-04-06 NOTE — Progress Notes (Signed)
Established Patient Office Visit   Subjective  Patient ID: Karen Martin, female    DOB: September 11, 1990  Age: 32 y.o. MRN: [id number]  Chief Complaint  Patient presents with   Annual Exam    Patient is a 32 year old female seen for CPE.  Patient states she is doing most part.  Patient endorses lumbosacral back pain x 3 months.  Started after falling on uneven ground while running half marathon in January 2024.  Patient states pain can be "debilitating", comes and goes, located midline.  Occasionally feels soreness side of hips.  Patient tried heat and stretching for symptoms.  Patient endorses some increased stress with job transition and ending of relationship.  Patient notes weight loss.  In the past issues with rapid, unintentional weight gain.  Continue to exercise regularly and monitoring diet.  Patient notes right eyelid edema on waking up mornings.  Patient denies changes in soaps, lotions, detergents, pruritus, drainage, or other eye irritation.  Left eye normal.  Pt endorses easy bruising.  Does play kickball, but has noticed more bruising than usual.      ROS Negative unless stated above    Objective:     BP 130/88 (BP Location: Left Arm, Patient Position: Sitting, Cuff Size: Large)   Pulse 99   Temp 98.9 F (37.2 C) (Oral)   Ht 6' (1.829 m)   Wt 183 lb 12.8 oz (83.4 kg)   LMP 03/17/2022 (Exact Date)   SpO2 97%   BMI 24.93 kg/m    Physical Exam Constitutional:      Appearance: Normal appearance.  HENT:     Head: Normocephalic and atraumatic.     Right Ear: Tympanic membrane, ear canal and external ear normal.     Left Ear: Tympanic membrane, ear canal and external ear normal.     Nose: Nose normal.     Mouth/Throat:     Mouth: Mucous membranes are moist.     Pharynx: No oropharyngeal exudate or posterior oropharyngeal erythema.  Eyes:     General: No scleral icterus.    Extraocular Movements: Extraocular movements intact.     Conjunctiva/sclera:  Conjunctivae normal.     Pupils: Pupils are equal, round, and reactive to light.  Neck:     Thyroid: No thyromegaly.  Cardiovascular:     Rate and Rhythm: Normal rate and regular rhythm.     Pulses: Normal pulses.     Heart sounds: Normal heart sounds. No murmur heard.    No friction rub.  Pulmonary:     Effort: Pulmonary effort is normal.     Breath sounds: Normal breath sounds. No wheezing, rhonchi or rales.  Abdominal:     General: Bowel sounds are normal.     Palpations: Abdomen is soft.     Tenderness: There is no abdominal tenderness.  Musculoskeletal:        General: No deformity. Normal range of motion.  Lymphadenopathy:     Cervical: No cervical adenopathy.  Skin:    General: Skin is warm and dry.     Findings: No lesion.     Comments: Ecchymosis in various stages of healing on bilateral LEs and forearms.   Neurological:     General: No focal deficit present.     Mental Status: She is alert and oriented to person, place, and time.  Psychiatric:        Mood and Affect: Mood normal.        Thought Content: Thought content normal.  04/06/2022   11:52 AM 07/11/2020   11:37 AM 09/01/2019    3:50 PM  Depression screen PHQ 2/9  Decreased Interest 1 0 1  Down, Depressed, Hopeless 2 0 1  PHQ - 2 Score 3 0 2  Altered sleeping 2 1 2   Tired, decreased energy 2 2 0  Change in appetite 2 1 1   Feeling bad or failure about yourself  2 0 2  Trouble concentrating 2 0 0  Moving slowly or fidgety/restless 0 0 0  Suicidal thoughts 0 0 0  PHQ-9 Score 13 4 7   Difficult doing work/chores Somewhat difficult  Somewhat difficult      04/06/2022   11:53 AM 09/01/2019    3:50 PM 07/07/2019   10:23 PM 05/03/2017    9:25 AM  GAD 7 : Generalized Anxiety Score  Nervous, Anxious, on Edge 2 1 1 1   Control/stop worrying 2 1 1  0  Worry too much - different things 2 1 1 1   Trouble relaxing 2 1 1  0  Restless 2 0 0 0  Easily annoyed or irritable 0 1 1 0  Afraid - awful might happen 1  2 0 1  Total GAD 7 Score 11 7 5 3   Anxiety Difficulty Somewhat difficult Somewhat difficult Somewhat difficult       No results found for any visits on 04/06/22.    Assessment & Plan:  Well adult exam Anticipatory guidance given including wearing seatbelts, smoke detectors in the home, increasing physical activity, increasing p.o. intake of water and vegetables. -labs -pap done with OB/Gyn -next CPE in 1 yr -     Lipid panel -     Hemoglobin A1c  Back pain, lumbosacral -     DG Lumbar Spine 2-3 Views -     Comprehensive metabolic panel -     CBC with Differential/Platelet  Easy bruising -Discussed possible causes including low platelets, liver dysfunction, medications -     Comprehensive metabolic panel -     TSH -     T4, free  Chronic constipation -Lifestyle modifications -Continue follow-up with GI as needed -     linaCLOtide; Take 1 capsule (145 mcg total) by mouth daily before breakfast.  Dispense: 30 capsule; Refill: 3  Edema of right eyelid -Discussed possible causes including seasonal allergies, other contact irritant -OTC antihistamine   Return if symptoms worsen or fail to improve.   Billie Ruddy, MD

## 2022-04-07 ENCOUNTER — Telehealth: Payer: Self-pay | Admitting: Family Medicine

## 2022-04-07 LAB — TSH: TSH: 0.47 u[IU]/mL (ref 0.35–5.50)

## 2022-04-07 LAB — T4, FREE: Free T4: 0.72 ng/dL (ref 0.60–1.60)

## 2022-04-07 NOTE — Telephone Encounter (Signed)
Pt states linaclotide (LINZESS) 145 MCG CAPS capsule isn't covered under her insurance. Asking for a generic substitute

## 2022-04-08 ENCOUNTER — Telehealth: Payer: Self-pay | Admitting: Family Medicine

## 2022-04-08 ENCOUNTER — Other Ambulatory Visit: Payer: Self-pay | Admitting: Family Medicine

## 2022-04-08 DIAGNOSIS — F4323 Adjustment disorder with mixed anxiety and depressed mood: Secondary | ICD-10-CM | POA: Diagnosis not present

## 2022-04-08 DIAGNOSIS — K5909 Other constipation: Secondary | ICD-10-CM

## 2022-04-08 MED ORDER — LINACLOTIDE 145 MCG PO CAPS: 145.0000 ug | ORAL_CAPSULE | Freq: Every day | ORAL | 3 refills | Status: DC

## 2022-04-08 NOTE — Telephone Encounter (Signed)
[employer] told patient to have PCP resubmit prescription for linaclotide (LINZESS) 145 MCG CAPS capsule with verbiage stating "patient meets criteria for non formulary exception"  [id number] - [place], [address] Phone: [phone]  Fax: [phone]    Patient's insurance lapses at the end of the month, asks that this is done as soon as possible

## 2022-04-08 NOTE — Telephone Encounter (Signed)
Rx resent.

## 2022-04-09 NOTE — Telephone Encounter (Signed)
There isn't a generic.

## 2022-04-09 NOTE — Telephone Encounter (Signed)
Noted  

## 2022-04-10 ENCOUNTER — Telehealth: Payer: Self-pay

## 2022-04-10 ENCOUNTER — Other Ambulatory Visit (HOSPITAL_COMMUNITY): Payer: Self-pay

## 2022-04-10 NOTE — Telephone Encounter (Signed)
PA request received via CMM for Linzess 145MCG capsules  PA has been submitted to [facility] and is pending determination.  Key: [id number]

## 2022-04-14 NOTE — Telephone Encounter (Signed)
Spoke to pt and advise pt to contact insurance to check what med can be covered under the insurance. Pt states her insurance ended on 04/12/2022.   Pt brought up last note about attempting to resubmit for the prescription. Check the submission from 04/10/2022 to [employer] and the status was unfavor.   Inform pt.

## 2022-04-15 ENCOUNTER — Encounter: Payer: BC Managed Care – PPO | Admitting: Family Medicine

## 2022-04-16 NOTE — Telephone Encounter (Signed)
Pharmacy Patient Advocate Encounter  Received notification from [facility] that the request for prior authorization for Linzess 145MCG capsules has been denied due to not meeting the definition of medical necessity..       Please be advised we currently do not have a Pharmacist to review denials, therefore you will need to process appeals accordingly as needed. Thanks for your support at this time.   Appeals must be in writing and can be mailed or faxed to:    Denial letter attached to charts

## 2022-04-20 NOTE — Telephone Encounter (Signed)
Please let patient know.

## 2022-04-23 NOTE — Telephone Encounter (Signed)
Spoke to pt on 04/14/22. Pt was already aware the status for Linzess. See telephone note on 04/14/2022.

## 2022-04-24 NOTE — Telephone Encounter (Signed)
As previously stated, there's no generic.

## 2023-01-04 IMAGING — US US PELVIS COMPLETE WITH TRANSVAGINAL
1 series · 14 of 25 positions shown · non-contrast
Comparison: None

CLINICAL DATA: A menorrhea

Weight gain
EXAM:
TRANSABDOMINAL AND TRANSVAGINAL ULTRASOUND OF PELVIS
TECHNIQUE: Both transabdominal and transvaginal ultrasound examinations of the
pelvis were performed. Transabdominal technique was performed for
global imaging of the pelvis including uterus, ovaries, adnexal
regions, and pelvic cul-de-sac. It was necessary to proceed with
endovaginal exam following the transabdominal exam to visualize the
ovaries.

[Series 1: us pelvis complete with transvaginal · 0.17mm/px · 14 of 68 slices shown]
[im 1/68]
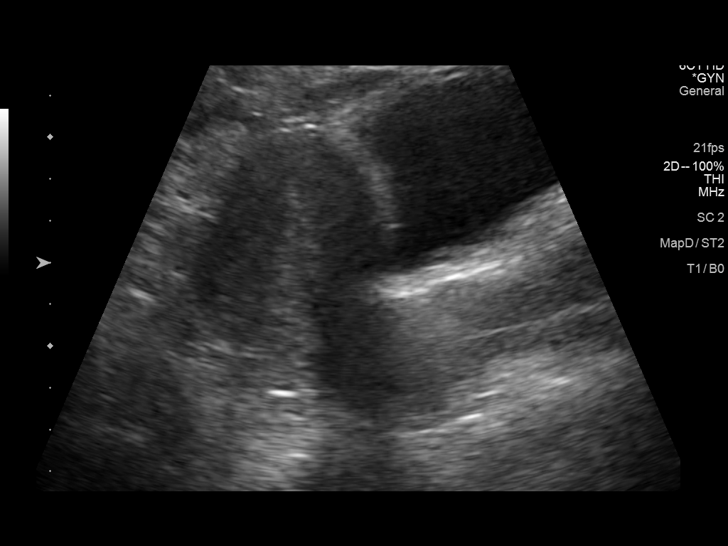
[im 6/68]
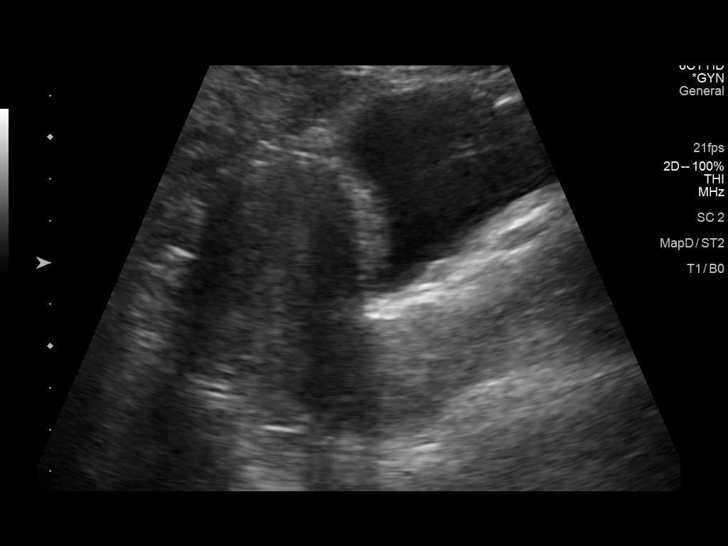
[im 12/68]
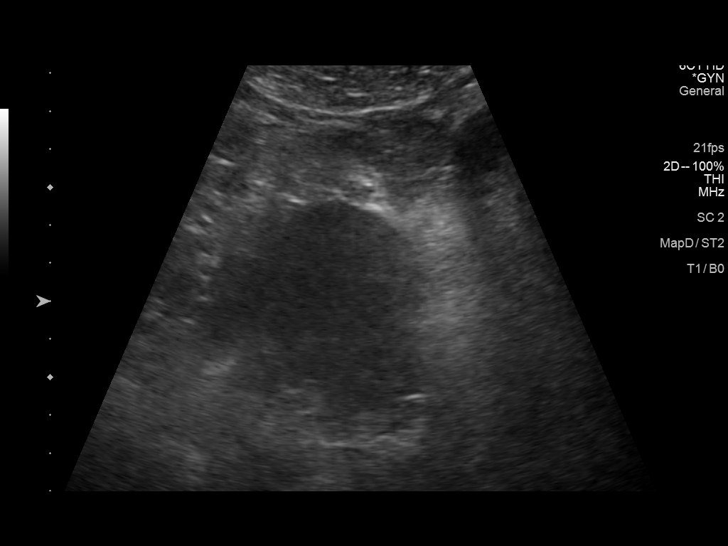
[im 17/68]
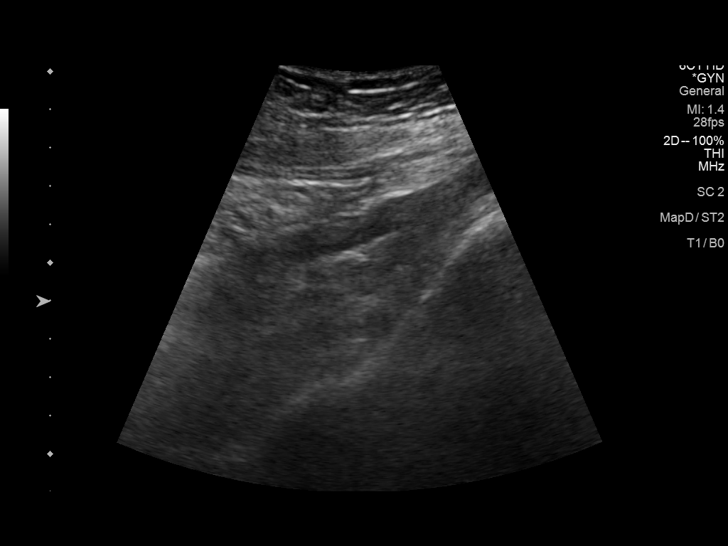
[im 23/68]
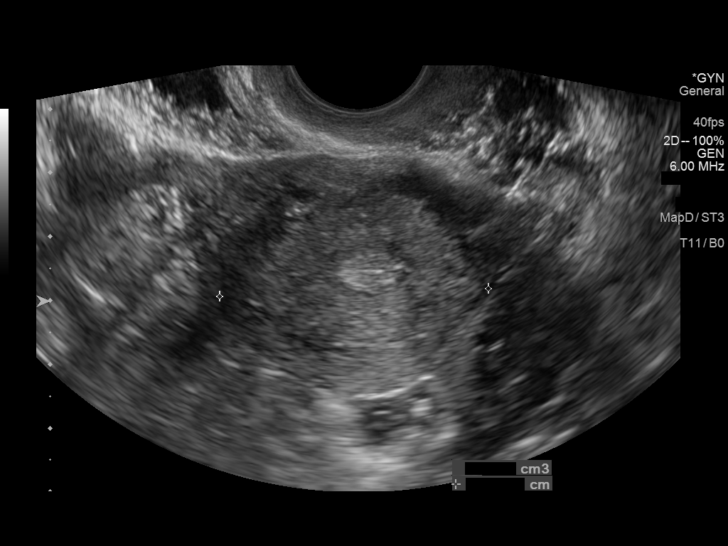
[im 26/68]
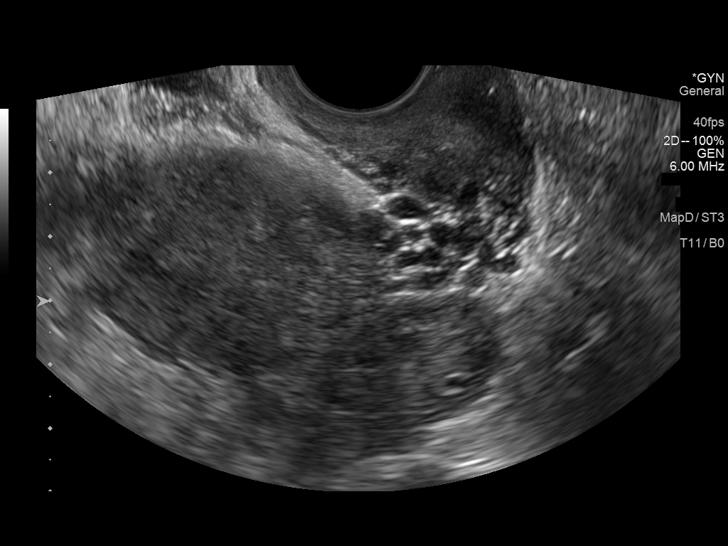
[im 31/68]
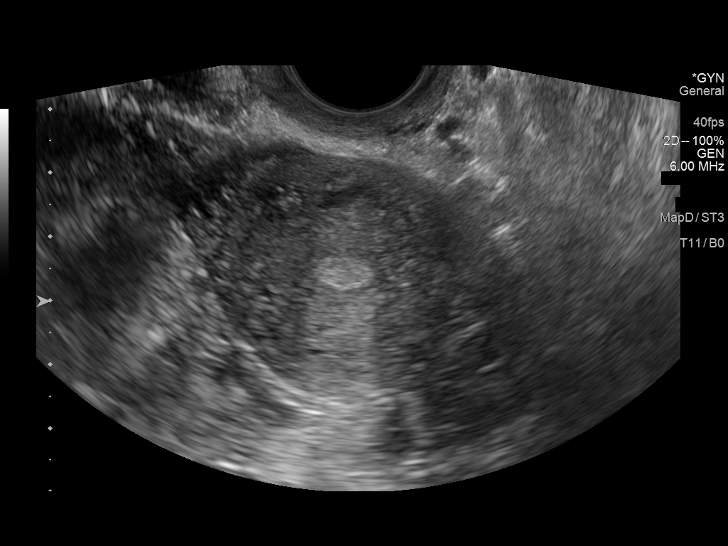
[im 37/68]
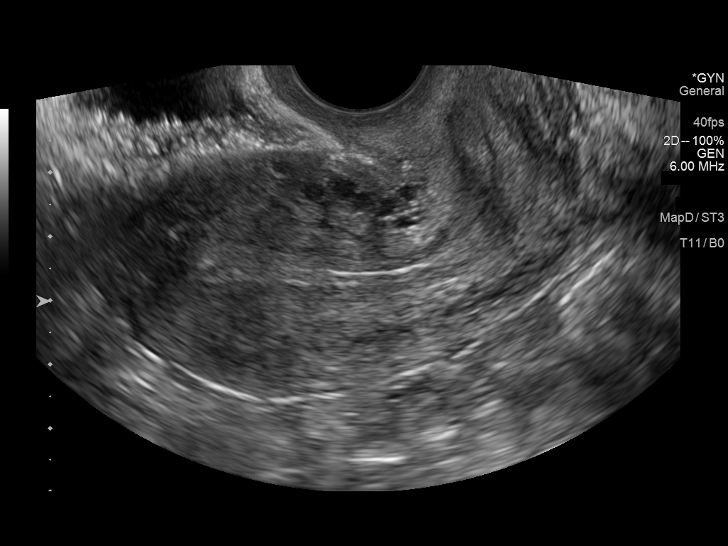
[im 42/68]
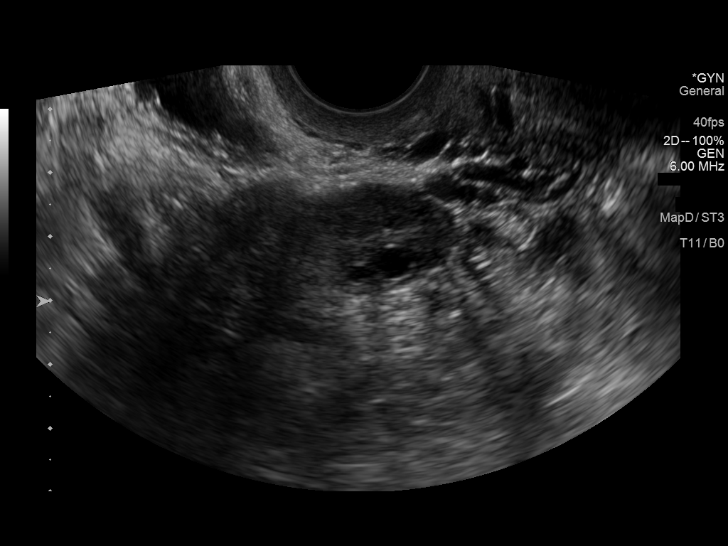
[im 45/68]
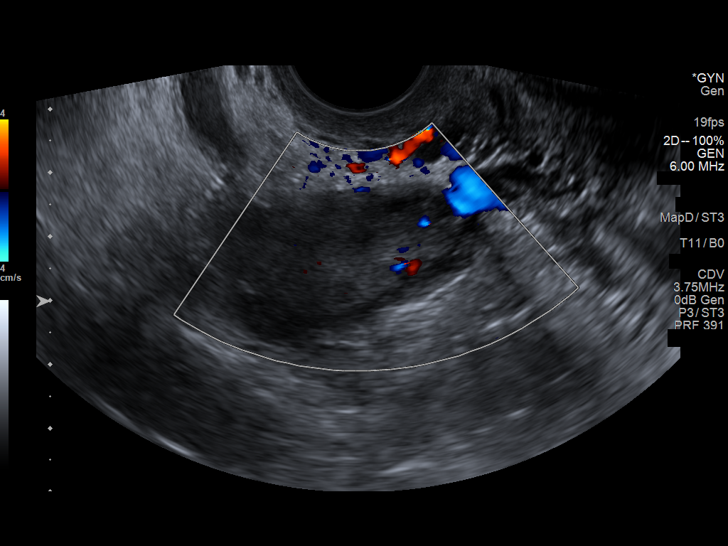
[im 51/68]
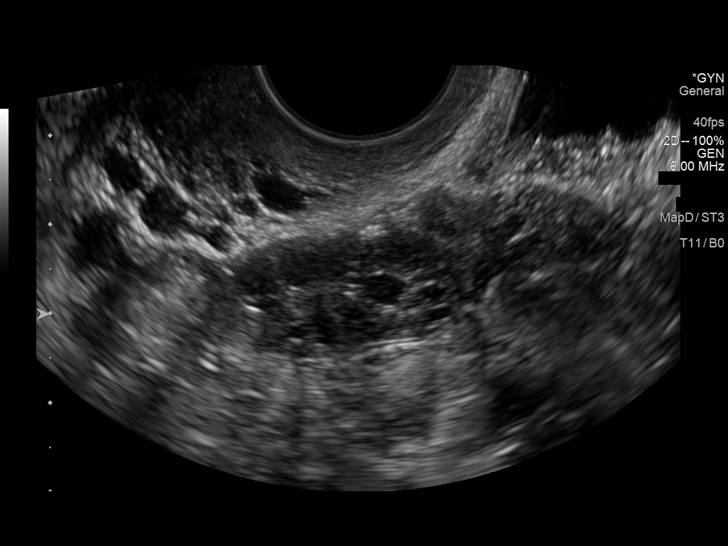
[im 56/68]
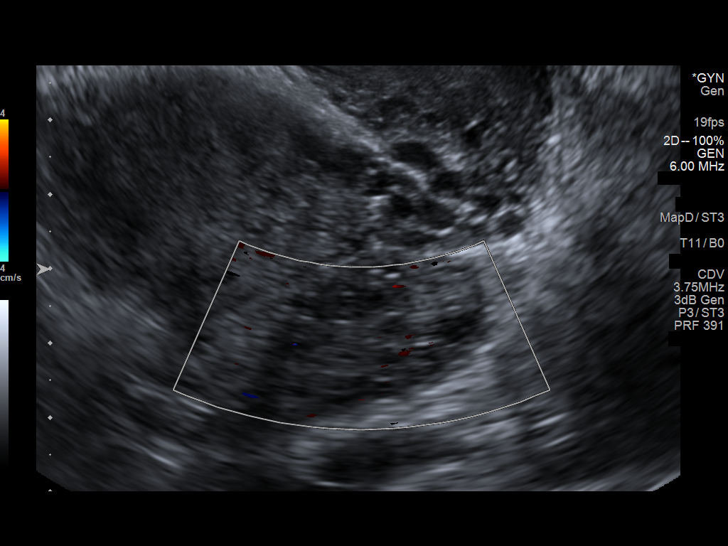
[im 62/68]
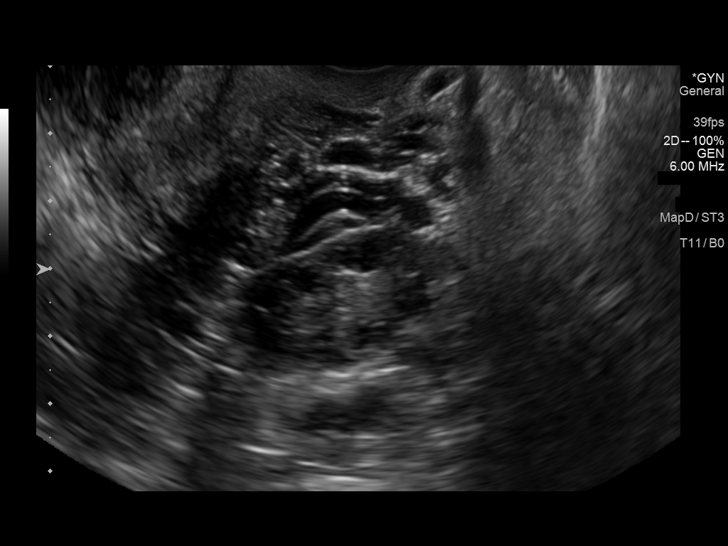
[im 68/68]
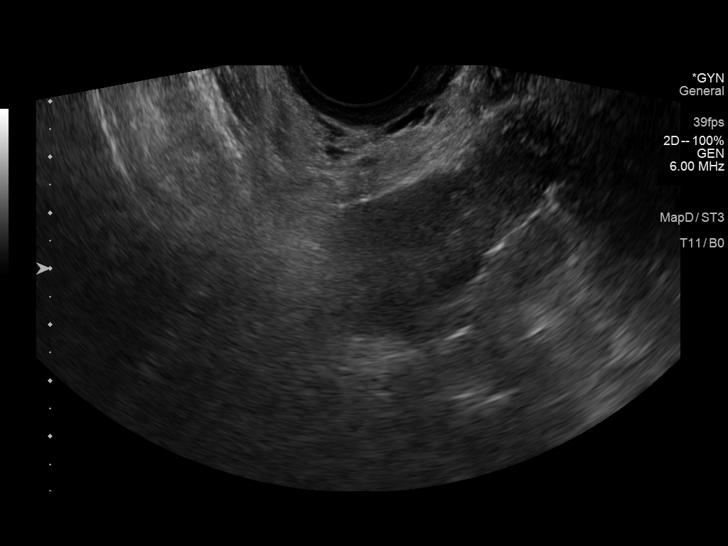

[14 of 25 positions shown; findings below may reference images not displayed]

FINDINGS: Uterus

Measurements: 7.3 x 3.8 x 4.2 cm = volume: 63 mL. No fibroids or
other mass visualized.

Endometrium

Thickness: 6 mm.  No focal abnormality visualized.

Right ovary

Measurements: 4.4 x 2.6 x 2.2 cm = volume: 13.4 mL. Normal
appearance/no adnexal mass.

Left ovary

Measurements: 3.9 x 1.8 x 1.6 cm = volume: 5.8 mL. Normal
appearance/no adnexal mass.

Other findings

No abnormal free fluid.
IMPRESSION: No significant sonographic abnormality of the pelvis.

## 2023-02-05 ENCOUNTER — Telehealth: Payer: Self-pay

## 2023-02-05 ENCOUNTER — Ambulatory Visit
Admission: RE | Admit: 2023-02-05 | Discharge: 2023-02-05 | Disposition: A | Discharge status: Home / Self Care | Payer: 59 | Source: Ambulatory Visit | Attending: Family Medicine

## 2023-02-05 VITALS — BP 137/93 | HR 79 | Temp 100.7°F | Resp 16

## 2023-02-05 DIAGNOSIS — R051 Acute cough: Secondary | ICD-10-CM | POA: Insufficient documentation

## 2023-02-05 DIAGNOSIS — J029 Acute pharyngitis, unspecified: Secondary | ICD-10-CM | POA: Insufficient documentation

## 2023-02-05 DIAGNOSIS — J101 Influenza due to other identified influenza virus with other respiratory manifestations: Secondary | ICD-10-CM

## 2023-02-05 DIAGNOSIS — Z113 Encounter for screening for infections with a predominantly sexual mode of transmission: Secondary | ICD-10-CM | POA: Diagnosis present

## 2023-02-05 LAB — POCT URINALYSIS DIP (MANUAL ENTRY)
Blood, UA: NEGATIVE
Glucose, UA: NEGATIVE mg/dL
Leukocytes, UA: NEGATIVE
Nitrite, UA: NEGATIVE
Protein Ur, POC: 30 mg/dL — AB
Spec Grav, UA: 1.02 (ref 1.010–1.025)
Urobilinogen, UA: 1 U/dL
pH, UA: 7 (ref 5.0–8.0)

## 2023-02-05 LAB — POC COVID19/FLU A&B COMBO
Covid Antigen, POC: NEGATIVE
Influenza A Antigen, POC: POSITIVE — AB
Influenza B Antigen, POC: NEGATIVE

## 2023-02-05 LAB — POCT URINE PREGNANCY: Preg Test, Ur: NEGATIVE

## 2023-02-05 LAB — POCT RAPID STREP A (OFFICE): Rapid Strep A Screen: NEGATIVE

## 2023-02-05 MED ORDER — PROMETHAZINE-DM 6.25-15 MG/5ML PO SYRP: 5.0000 mL | ORAL_SOLUTION | Freq: Four times a day (QID) | ORAL | 0 refills | Status: DC | PRN

## 2023-02-05 NOTE — Telephone Encounter (Signed)
Pt requests pharmacy change for promethazine. Pharmacy verified with patient and medication sent.

## 2023-02-05 NOTE — ED Triage Notes (Signed)
Pt presents to UC for std testing. No symptoms. Pt states about 1 week ago, she went out and "blacked out and woke up in someone's bed."  Pt also reports sore throat, cough, runny nose, sob, body aches, chills, low grade fever x4 days. Taking Mucinex, dayquil and nyquil.

## 2023-02-05 NOTE — Discharge Instructions (Signed)
You have tested positive for flu A.  You may take Promethazine DM as needed for cough.  Please note this medication can make you drowsy.  Do not drink alcohol or drive while on this medication.  Lots of rest and fluids.  Continue Tylenol or ibuprofen as needed for fever management.  The clinic will also contact you with results of the testing done today if positive.  Please follow-up with your PCP in 2 to 3 days for recheck.  Please go to the ER if you develop any worsening symptoms.  I hope you feel better soon!
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
## 2023-02-06 LAB — RPR: RPR Ser Ql: NONREACTIVE

## 2023-02-06 LAB — HIV ANTIBODY (ROUTINE TESTING W REFLEX): HIV Screen 4th Generation wRfx: NONREACTIVE

## 2023-02-09 LAB — CERVICOVAGINAL ANCILLARY ONLY
Chlamydia: NEGATIVE
Comment: NEGATIVE
Comment: NEGATIVE
Comment: NORMAL
Neisseria Gonorrhea: NEGATIVE
Trichomonas: NEGATIVE

## 2023-04-07 ENCOUNTER — Encounter: Admitting: Family Medicine

## 2023-05-05 ENCOUNTER — Encounter: Admitting: Family Medicine

## 2023-05-05 DIAGNOSIS — Z0000 Encounter for general adult medical examination without abnormal findings: Secondary | ICD-10-CM

## 2023-05-13 ENCOUNTER — Telehealth: Payer: Self-pay | Admitting: *Deleted

## 2023-05-13 ENCOUNTER — Encounter: Payer: Self-pay | Admitting: Family Medicine

## 2023-05-13 ENCOUNTER — Ambulatory Visit: Admitting: Family Medicine

## 2023-05-13 VITALS — BP 138/82 | HR 71 | Temp 98.5°F | Ht 72.0 in | Wt 210.2 lb

## 2023-05-13 DIAGNOSIS — Z1159 Encounter for screening for other viral diseases: Secondary | ICD-10-CM

## 2023-05-13 DIAGNOSIS — F900 Attention-deficit hyperactivity disorder, predominantly inattentive type: Secondary | ICD-10-CM | POA: Diagnosis not present

## 2023-05-13 DIAGNOSIS — K5909 Other constipation: Secondary | ICD-10-CM | POA: Diagnosis not present

## 2023-05-13 DIAGNOSIS — Z0000 Encounter for general adult medical examination without abnormal findings: Secondary | ICD-10-CM

## 2023-05-13 DIAGNOSIS — R635 Abnormal weight gain: Secondary | ICD-10-CM

## 2023-05-13 LAB — CBC WITH DIFFERENTIAL/PLATELET
Basophils Absolute: 0 10*3/uL (ref 0.0–0.1)
Basophils Relative: 0.4 % (ref 0.0–3.0)
Eosinophils Absolute: 0.3 10*3/uL (ref 0.0–0.7)
Eosinophils Relative: 3 % (ref 0.0–5.0)
HCT: 41.7 % (ref 36.0–46.0)
Hemoglobin: 13.8 g/dL (ref 12.0–15.0)
Lymphocytes Relative: 16.8 % (ref 12.0–46.0)
Lymphs Abs: 1.5 10*3/uL (ref 0.7–4.0)
MCHC: 33 g/dL (ref 30.0–36.0)
MCV: 96.5 fl (ref 78.0–100.0)
Monocytes Absolute: 0.8 10*3/uL (ref 0.1–1.0)
Monocytes Relative: 8.8 % (ref 3.0–12.0)
Neutro Abs: 6.4 10*3/uL (ref 1.4–7.7)
Neutrophils Relative %: 71 % (ref 43.0–77.0)
Platelets: 280 10*3/uL (ref 150.0–400.0)
RBC: 4.32 Mil/uL (ref 3.87–5.11)
RDW: 13.8 % (ref 11.5–15.5)
WBC: 9 10*3/uL (ref 4.0–10.5)

## 2023-05-13 LAB — COMPREHENSIVE METABOLIC PANEL WITH GFR
ALT: 16 U/L (ref 0–35)
AST: 20 U/L (ref 0–37)
Albumin: 4.6 g/dL (ref 3.5–5.2)
Alkaline Phosphatase: 39 U/L (ref 39–117)
BUN: 11 mg/dL (ref 6–23)
CO2: 27 meq/L (ref 19–32)
Calcium: 9.1 mg/dL (ref 8.4–10.5)
Chloride: 102 meq/L (ref 96–112)
Creatinine, Ser: 0.66 mg/dL (ref 0.40–1.20)
GFR: 116.02 mL/min (ref >60.00)
Glucose, Bld: 82 mg/dL (ref 70–99)
Potassium: 3.9 meq/L (ref 3.5–5.1)
Sodium: 136 meq/L (ref 135–145)
Total Bilirubin: 0.7 mg/dL (ref 0.2–1.2)
Total Protein: 7.5 g/dL (ref 6.0–8.3)

## 2023-05-13 LAB — LIPID PANEL
Cholesterol: 166 mg/dL (ref 0–200)
HDL: 75.7 mg/dL (ref >39.00)
LDL Cholesterol: 82 mg/dL (ref 0–99)
NonHDL: 90.37
Total CHOL/HDL Ratio: 2
Triglycerides: 43 mg/dL (ref 0.0–149.0)
VLDL: 8.6 mg/dL (ref 0.0–40.0)

## 2023-05-13 LAB — T4, FREE: Free T4: 0.73 ng/dL (ref 0.60–1.60)

## 2023-05-13 LAB — HEMOGLOBIN A1C: Hgb A1c MFr Bld: 5.3 % (ref 4.6–6.5)

## 2023-05-13 LAB — TSH: TSH: 0.72 u[IU]/mL (ref 0.35–5.50)

## 2023-05-13 MED ORDER — LISDEXAMFETAMINE DIMESYLATE 30 MG PO CAPS: 30.0000 mg | ORAL_CAPSULE | Freq: Every day | ORAL | 0 refills | Status: AC

## 2023-05-13 MED ORDER — LISDEXAMFETAMINE DIMESYLATE 30 MG PO CAPS
30.0000 mg | ORAL_CAPSULE | Freq: Every day | ORAL | 0 refills | Status: DC
Start: 2023-05-13 — End: 2023-05-13

## 2023-05-13 MED ORDER — LINACLOTIDE 145 MCG PO CAPS: 145.0000 ug | ORAL_CAPSULE | Freq: Every day | ORAL | 3 refills | Status: DC

## 2023-05-13 MED ORDER — LISDEXAMFETAMINE DIMESYLATE 30 MG PO CAPS: 30.0000 mg | ORAL_CAPSULE | Freq: Every day | ORAL | 0 refills | Status: DC

## 2023-05-13 MED ORDER — LINACLOTIDE 145 MCG PO CAPS: 145.0000 ug | ORAL_CAPSULE | Freq: Every day | ORAL | 3 refills | Status: AC

## 2023-05-13 NOTE — Telephone Encounter (Signed)
 Copied from CRM [id number]. Topic: Clinical - Prescription Issue >> May 13, 2023 11:25 AM Artemio Larry wrote: Reason for CRM: Patient was seen today and needs the linaclotide  and lisdexamfetamine medications to another pharmacy. Please send to [id number] - [place], [place] - [address] AT [facility] [address]. The medications were sent to the wrong pharmacy.

## 2023-05-13 NOTE — Telephone Encounter (Signed)
 Done

## 2023-05-13 NOTE — Progress Notes (Signed)
 Established Patient Office Visit   Subjective  Patient ID: Karen Martin, female    DOB: November 03, 1990  Age: 33 y.o. MRN: [id number]  Chief Complaint  Patient presents with   Annual Exam    Patient is a 33 year old female seen for CPE and follow-up on chronic issues.  Requested Pap however menses started today and wishes to wait.  Patient endorses continued fluctuations in weight.  Was able to maintain at 180 lbs until recently.  Previously sent to endocrinology however no workup done.  Patient with history of chronic constipation.  Currently having a BM daily but endorses incomplete emptying and straining.  Previously on Linzess  samples from GI however unable to obtain medication as not covered by insurance.  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  Notes less stress.  Having some difficulty completing a task before starting another.  Can get distracted/have difficulty concentrating.  Makes a list which helps.  So far symptoms have not affected her overall performance.  Patient notes history of ADHD as a child.  Was on Vyvanse  in high school.  Did not want to be dependent in college on medication so stopped taking.  Patient inquires about restarting medication as afraid will slowly start to affect work.  Patient has several OTC supplements and wants to know if she should be taking them.  Has a multivitamin, vitamin D3 1000, magnesium 200 mg, melatonin 5 mg, biotin 5000 mcg, vitamin B12 1000 mcg, psyllium 3.6 g, Prilosec OTC 20 mg, and a supplement for focus which has 168 mcg of caffeine.  Patient unsure if melatonin is helping as often has difficulty sleeping.  Takes melatonin around 9 PM.    Patient Active Problem List   Diagnosis Date Noted   Chronic constipation 07/22/2021   Weight gain 01/16/2021   History reviewed. No pertinent past medical history. Past Surgical History:  Procedure Laterality Date   WISDOM TOOTH EXTRACTION     Social History    Tobacco Use   Smoking status: Former    Types: Cigarettes   Smokeless tobacco: Never  Vaping Use   Vaping status: Never Used  Substance Use Topics   Alcohol use: Yes    Comment: weekly   Drug use: Yes    Types: Marijuana   Family History  Problem Relation Age of Onset   Prostate cancer Father 30   Lung cancer Paternal Grandmother    Colon cancer Neg Hx    Esophageal cancer Neg Hx    Stomach cancer Neg Hx    No Known Allergies    ROS Negative unless stated above    Objective:     BP 138/82 (BP Location: Left Arm, Patient Position: Sitting, Cuff Size: Normal)   Pulse 71   Temp 98.5 F (36.9 C) (Oral)   Ht 6' (1.829 m)   Wt 210 lb 3.2 oz (95.3 kg)   LMP 05/12/2023 (Approximate)   SpO2 97%   BMI 28.51 kg/m  BP Readings from Last 3 Encounters:  05/13/23 138/82  02/05/23 (!) 137/93  04/06/22 130/88   Wt Readings from Last 3 Encounters:  05/13/23 210 lb 3.2 oz (95.3 kg)  04/06/22 183 lb 12.8 oz (83.4 kg)  09/09/21 214 lb 4.8 oz (97.2 kg)      Physical Exam Constitutional:      Appearance: Normal appearance.     Comments: Face appears more round.  No post cervical adipose pad.  HENT:     Head:  Normocephalic and atraumatic.     Right Ear: Tympanic membrane, ear canal and external ear normal.     Left Ear: Tympanic membrane, ear canal and external ear normal.     Nose: Nose normal.     Mouth/Throat:     Mouth: Mucous membranes are moist.     Pharynx: No oropharyngeal exudate or posterior oropharyngeal erythema.  Eyes:     General: No scleral icterus.    Extraocular Movements: Extraocular movements intact.     Conjunctiva/sclera: Conjunctivae normal.     Pupils: Pupils are equal, round, and reactive to light.  Neck:     Thyroid : No thyromegaly.  Cardiovascular:     Rate and Rhythm: Normal rate and regular rhythm.     Pulses: Normal pulses.     Heart sounds: Normal heart sounds. No murmur heard.    No friction rub.  Pulmonary:     Effort: Pulmonary  effort is normal.     Breath sounds: Normal breath sounds. No wheezing, rhonchi or rales.  Abdominal:     General: Bowel sounds are normal.     Palpations: Abdomen is soft.     Tenderness: There is no abdominal tenderness.  Musculoskeletal:        General: No deformity. Normal range of motion.  Lymphadenopathy:     Cervical: No cervical adenopathy.  Skin:    General: Skin is warm and dry.     Findings: No lesion.  Neurological:     General: No focal deficit present.     Mental Status: She is alert and oriented to person, place, and time.  Psychiatric:        Mood and Affect: Mood normal.        Thought Content: Thought content normal.        05/13/2023   11:15 AM 04/06/2022   11:52 AM 07/11/2020   11:37 AM  Depression screen PHQ 2/9  Decreased Interest 1 1 0  Down, Depressed, Hopeless 1 2 0  PHQ - 2 Score 2 3 0  Altered sleeping 3 2 1   Tired, decreased energy 3 2 2   Change in appetite 3 2 1   Feeling bad or failure about yourself  1 2 0  Trouble concentrating 3 2 0  Moving slowly or fidgety/restless 1 0 0  Suicidal thoughts 0 0 0  PHQ-9 Score 16 13 4   Difficult doing work/chores Very difficult Somewhat difficult       05/13/2023   11:16 AM 04/06/2022   11:53 AM 09/01/2019    3:50 PM 07/07/2019   10:23 PM  GAD 7 : Generalized Anxiety Score  Nervous, Anxious, on Edge 1 2 1 1   Control/stop worrying 1 2 1 1   Worry too much - different things 2 2 1 1   Trouble relaxing 1 2 1 1   Restless 2 2 0 0  Easily annoyed or irritable 1 0 1 1  Afraid - awful might happen 0 1 2 0  Total GAD 7 Score 8 11 7 5   Anxiety Difficulty  Somewhat difficult Somewhat difficult Somewhat difficult     No results found for any visits on 05/13/23.    Assessment & Plan:  Well adult exam -     CBC with Differential/Platelet; Future -     Comprehensive metabolic panel with GFR; Future -     Hemoglobin A1c; Future -     Lipid panel; Future -     T4, free; Future -     TSH; Future  Need for  hepatitis C screening test -     Hepatitis C antibody  Weight gain -     Hemoglobin A1c; Future -     Lipid panel; Future -     T4, free; Future -     TSH; Future -     Cortisol; Future  Attention deficit hyperactivity disorder (ADHD), predominantly inattentive type -     Lisdexamfetamine Dimesylate ; Take 1 capsule (30 mg total) by mouth daily.  Dispense: 30 capsule; Refill: 0 -     Lisdexamfetamine Dimesylate ; Take 1 capsule (30 mg total) by mouth daily.  Dispense: 30 capsule; Refill: 0 -     Lisdexamfetamine Dimesylate ; Take 1 capsule (30 mg total) by mouth daily.  Dispense: 30 capsule; Refill: 0  Chronic constipation -     linaCLOtide ; Take 1 capsule (145 mcg total) by mouth daily before breakfast.  Dispense: 90 capsule; Refill: 3   Age-appropriate health screenings discussed.  Will obtain labs.  Last Pap 12/21/2021.  History of abnormal Pap ASCUS, HPV negative on 12/22/2018.  Patient wishes to wait on having Pap done 2/2 menses starting today.  Next CPE in 1 year.  Continued fluctuations in weight.  Previously labs to evaluate thyroid  and possible causes of wt gain were normal.  Was also sent to Endo but no additional guidance was offered.  Will order Cortisol to be done first thing in am.  History of ADHD as a child.  Previously managed with modifications, but now becoming more difficult to do so.  Discussed restarting medication.  Continue other supportive care to help with staying on task/focused.  Rx for Vyvanse  x 3 months sent to pharmacy.  Rx resent as initially sent to follow-up in the next few weeks to see if medication is working.  Restart Linzess  for history of chronic constipation if covered by insurance.   On day of service, 37 minutes spent caring for this patient face-to-face, reviewing the chart, counseling and/or coordinating care for plan and treatment of diagnosis below.     Return in about 6 weeks (around 06/24/2023).   Viola Greulich, MD

## 2023-05-13 NOTE — Telephone Encounter (Signed)
 Called [facility] on [address] and canceled linaclotide  and lisdexamfetamine

## 2023-05-14 LAB — HEPATITIS C ANTIBODY: Hepatitis C Ab: NONREACTIVE

## 2023-05-18 ENCOUNTER — Other Ambulatory Visit (HOSPITAL_COMMUNITY): Payer: Self-pay

## 2023-05-18 ENCOUNTER — Telehealth: Payer: Self-pay

## 2023-05-18 NOTE — Telephone Encounter (Signed)
 Pharmacy Patient Advocate Encounter   Received notification from Patient Pharmacy that prior authorization for Vyvanse  30 caps is required/requested.   Insurance verification completed.   The patient is insured through [facility] .   Per test claim: PA required; PA submitted to above mentioned insurance via CoverMyMeds Key/confirmation #/EOC [id number] Status is pending

## 2023-05-18 NOTE — Telephone Encounter (Signed)
 Pharmacy Patient Advocate Encounter  Received notification from [facility] that Prior Authorization for Vyvanse  30 has been APPROVED from 05/18/23 to 05/18/26. Ran test claim, Copay is $30.00. This test claim was processed through [facility] copay amounts may vary at other pharmacies due to pharmacy/plan contracts, or as the patient moves through the different stages of their insurance plan.   PA #/Case ID/Reference #: [id number]

## 2023-05-19 ENCOUNTER — Telehealth: Payer: Self-pay

## 2023-05-19 NOTE — Telephone Encounter (Signed)
 Copied from CRM [id number]. Topic: Clinical - Prescription Issue >> May 13, 2023 11:25 AM Artemio Larry wrote: Reason for CRM: Patient was seen today and needs the linaclotide  and lisdexamfetamine medications to another pharmacy. Please send to [id number] - [place], [place] - [address] AT [facility] OF [address]. The medications were sent to the wrong pharmacy. >> May 19, 2023  2:24 PM Caliyah H wrote: Patient called today to follow up the PA status for Vyvanse . After reviewing the patient's chart, it shows that the PA was approved. When informed of the approval, the patient mentioned they switched pharmacies and had informed the staff at their last office visit. The patient is asking if they can pick up the medication from the previous pharmacy or if it can be transferred to their new preferred pharmacy.

## 2023-05-20 NOTE — Telephone Encounter (Signed)
 Medication has been sent.

## 2023-05-26 ENCOUNTER — Other Ambulatory Visit

## 2023-05-27 ENCOUNTER — Other Ambulatory Visit

## 2023-05-28 ENCOUNTER — Other Ambulatory Visit

## 2023-06-24 ENCOUNTER — Encounter: Payer: Self-pay | Admitting: Family Medicine

## 2023-06-24 ENCOUNTER — Ambulatory Visit: Admitting: Family Medicine

## 2023-06-24 VITALS — BP 124/82 | HR 71 | Temp 98.5°F | Ht 72.0 in | Wt 209.4 lb

## 2023-06-24 DIAGNOSIS — R635 Abnormal weight gain: Secondary | ICD-10-CM

## 2023-06-24 DIAGNOSIS — K5909 Other constipation: Secondary | ICD-10-CM | POA: Diagnosis not present

## 2023-06-24 DIAGNOSIS — F900 Attention-deficit hyperactivity disorder, predominantly inattentive type: Secondary | ICD-10-CM | POA: Diagnosis not present

## 2023-06-24 MED ORDER — LISDEXAMFETAMINE DIMESYLATE 40 MG PO CAPS: 40.0000 mg | ORAL_CAPSULE | ORAL | 0 refills | Status: DC

## 2023-06-24 MED ORDER — LISDEXAMFETAMINE DIMESYLATE 40 MG PO CAPS: 40.0000 mg | ORAL_CAPSULE | ORAL | 0 refills | Status: AC

## 2023-06-24 NOTE — Progress Notes (Signed)
 Established Patient Office Visit   Subjective  Patient ID: Karen Martin, female    DOB: Nov 02, 1990  Age: 33 y.o. MRN: [id number]  Chief Complaint  Patient presents with   Medical Management of Chronic Issues    ADHD follow-up     Patient is a 33 year old female seen for follow-up.  Patient started on Vyvanse  30 mg daily at last OFV.  Patient previously on medication as a child and wanted to restart due to decreased concentration at new job.  Patient notes some improvement in ability to focus but still starting task before completing initial task.  Patient also with decreased desire to do tasks.  Denies issues with sleep.  Patient mentions as a child when on Vyvanse  60 mg did not like the effects/felt like it was too much medication.  Patient notes improvement in constipation since restarting Linzess .  Patient previously given samples of medication by GI when insurance would not cover cost.  Since restarting having BM every 4 days and not having to strain.  Initially had stomach cramping on restarting medication.  Weight stable.  Patient was hoping to have more weight loss with restarting medications.  Weight has been gradually increasing over the last 1-2 years.  TSH again normal at last OFV on 05/13/2023.  Patient was planning to have cortisol drawn after this visit.    Patient Active Problem List   Diagnosis Date Noted   Chronic constipation 07/22/2021   Weight gain 01/16/2021   History reviewed. No pertinent past medical history. Past Surgical History:  Procedure Laterality Date   WISDOM TOOTH EXTRACTION     Social History   Tobacco Use   Smoking status: Former    Types: Cigarettes   Smokeless tobacco: Never  Vaping Use   Vaping status: Never Used  Substance Use Topics   Alcohol use: Yes    Comment: weekly   Drug use: Yes    Types: Marijuana   Family History  Problem Relation Age of Onset   Prostate cancer Father 38   Lung cancer Paternal Grandmother    Colon  cancer Neg Hx    Esophageal cancer Neg Hx    Stomach cancer Neg Hx    No Known Allergies  ROS Negative unless stated above    Objective:     BP 124/82 (BP Location: Left Arm, Patient Position: Sitting, Cuff Size: Normal)   Pulse 71   Temp 98.5 F (36.9 C) (Oral)   Ht 6' (1.829 m)   Wt 209 lb 6.4 oz (95 kg)   LMP 06/09/2023 (Exact Date)   SpO2 96%   BMI 28.40 kg/m  BP Readings from Last 3 Encounters:  06/24/23 124/82  05/13/23 138/82  02/05/23 (!) 137/93   Wt Readings from Last 3 Encounters:  06/24/23 209 lb 6.4 oz (95 kg)  05/13/23 210 lb 3.2 oz (95.3 kg)  04/06/22 183 lb 12.8 oz (83.4 kg)      Physical Exam Constitutional:      General: She is not in acute distress.    Appearance: Normal appearance.  HENT:     Head: Normocephalic and atraumatic.     Nose: Nose normal.     Mouth/Throat:     Mouth: Mucous membranes are moist.   Cardiovascular:     Rate and Rhythm: Normal rate and regular rhythm.     Heart sounds: Normal heart sounds. No murmur heard.    No gallop.  Pulmonary:     Effort: Pulmonary effort is normal. No  respiratory distress.     Breath sounds: Normal breath sounds. No wheezing, rhonchi or rales.   Skin:    General: Skin is warm and dry.     Comments: Face round.  No hirsutism, striae, posterior cervical adipose deposition.   Neurological:     Mental Status: She is alert and oriented to person, place, and time.        06/24/2023   10:44 AM 05/13/2023   11:15 AM 04/06/2022   11:52 AM  Depression screen PHQ 2/9  Decreased Interest 0 1 1  Down, Depressed, Hopeless 1 1 2   PHQ - 2 Score 1 2 3   Altered sleeping 2 3 2   Tired, decreased energy 2 3 2   Change in appetite 2 3 2   Feeling bad or failure about yourself   1 2  Trouble concentrating 1 3 2   Moving slowly or fidgety/restless 0 1 0  Suicidal thoughts 0 0 0  PHQ-9 Score 8 16 13   Difficult doing work/chores Somewhat difficult Very difficult Somewhat difficult      06/24/2023    10:45 AM 05/13/2023   11:16 AM 04/06/2022   11:53 AM 09/01/2019    3:50 PM  GAD 7 : Generalized Anxiety Score  Nervous, Anxious, on Edge 2 1 2 1   Control/stop worrying 2 1 2 1   Worry too much - different things 2 2 2 1   Trouble relaxing 2 1 2 1   Restless 0 2 2 0  Easily annoyed or irritable 1 1 0 1  Afraid - awful might happen 0 0 1 2  Total GAD 7 Score 9 8 11 7   Anxiety Difficulty Somewhat difficult  Somewhat difficult Somewhat difficult     No results found for any visits on 06/24/23.    Assessment & Plan:   Attention deficit hyperactivity disorder (ADHD), predominantly inattentive type -     Lisdexamfetamine Dimesylate ; Take 1 capsule (40 mg total) by mouth every morning.  Dispense: 30 capsule; Refill: 0 -     Lisdexamfetamine Dimesylate ; Take 1 capsule (40 mg total) by mouth every morning.  Dispense: 30 capsule; Refill: 0 -     Lisdexamfetamine Dimesylate ; Take 1 capsule (40 mg total) by mouth every morning.  Dispense: 30 capsule; Refill: 0  Chronic constipation  Weight gain -     Cortisol; Future -     ACTH; Future -     Aldosterone; Future -     Renin; Future  ADHD symptoms somewhat improved but not completely controlled.  Will d/c Vyvanse  30 mg. Increase dose to by mouth 40 mg.  PDMP reviewed and appropriate.  Refills x 3 months sent to pharmacy.  Consider counseling and other changes to environment to help with focus and motivation.  Chronic constipation improving.  Continue Linzess  145 mcg daily.  Increase intake of water and fiber.  Weight gain stable.  Currently 209.4 lbs.  Lost 1 pound since last OFV.  BMI 28.4 kg/m.  Unclear what is contributing to patient's steady weight gain over the last 1-2 years.  TSH has remained normal as well as other labs.  Last TSH 0.72 on 05/13/2023.  Patient previously sent to endocrinology unfortunately no recommendations/additional workup provided.  Patient wishes to pursue cortisol levels.  Will return in one morning next week to have  drawn.  Continue lifestyle modifications.  Return in about 3 months (around 09/24/2023) for chronic conditions.   Viola Greulich, MD

## 2023-06-30 ENCOUNTER — Telehealth: Payer: Self-pay

## 2023-06-30 NOTE — Telephone Encounter (Signed)
 Spoke with patient, the lab will be open at 7 per Katelyn

## 2023-06-30 NOTE — Telephone Encounter (Signed)
 Copied from CRM [id number]. Topic: General - Other >> Jun 30, 2023 11:54 AM Karen Martin wrote: Reason for CRM: Patient is schedule for lab at 8am 6/26 and would like to see about coming in at 7 she said she called the lab and said it won't be on the schedule but they can do at that time, please contact patient if that is possible

## 2023-07-01 ENCOUNTER — Other Ambulatory Visit

## 2023-07-08 ENCOUNTER — Other Ambulatory Visit (INDEPENDENT_AMBULATORY_CARE_PROVIDER_SITE_OTHER)

## 2023-07-08 ENCOUNTER — Other Ambulatory Visit

## 2023-07-08 DIAGNOSIS — R635 Abnormal weight gain: Secondary | ICD-10-CM

## 2023-07-09 ENCOUNTER — Telehealth: Payer: Self-pay

## 2023-07-09 ENCOUNTER — Other Ambulatory Visit (HOSPITAL_COMMUNITY): Payer: Self-pay

## 2023-07-09 NOTE — Telephone Encounter (Unsigned)
 Copied from CRM [id number]. Topic: Clinical - Medication Prior Auth >> Jul 09, 2023  1:13 PM Viola F wrote: Reason for CRM: Patient pharmacy told patient that she needs prior authorization for the Lisdexamfetamine (VYVANSE ) 40 MG capsule medication. Please advise and call patient with an update since she is completely out.

## 2023-07-12 ENCOUNTER — Other Ambulatory Visit (HOSPITAL_COMMUNITY): Payer: Self-pay

## 2023-07-12 ENCOUNTER — Telehealth: Payer: Self-pay

## 2023-07-12 ENCOUNTER — Ambulatory Visit: Payer: Self-pay | Admitting: Family Medicine

## 2023-07-12 NOTE — Telephone Encounter (Signed)
 Pharmacy Patient Advocate Encounter   Received notification from Pt Calls Messages that prior authorization for Vyvanse  40 caps is required/requested.   Insurance verification completed.   The patient is insured through [facility] .   Per test claim: PA required; PA submitted to above mentioned insurance via CoverMyMeds Key/confirmation #/EOC [id number] Status is pending

## 2023-07-12 NOTE — Telephone Encounter (Signed)
 Pharmacy Patient Advocate Encounter  Received notification from [facility] that Prior Authorization for Vyvanse  40 caps has been APPROVED from 07/12/23 to 07/11/24. Ran test claim, Copay is $30.00. This test claim was processed through [facility]- copay amounts may vary at other pharmacies due to pharmacy/plan contracts, or as the patient moves through the different stages of their insurance plan.   PA #/Case ID/Reference #: [id number]

## 2023-07-14 LAB — RENIN: Renin Activity: 1.27 ng/mL/h (ref 0.25–5.82)

## 2023-07-14 LAB — ALDOSTERONE: Aldosterone: 4 ng/dL

## 2023-07-14 LAB — CORTISOL: Cortisol, Plasma: 16.8 ug/dL

## 2023-07-14 LAB — ACTH: C206 ACTH: 20 pg/mL (ref 6–50)

## 2023-07-23 ENCOUNTER — Ambulatory Visit: Payer: Self-pay

## 2023-07-23 NOTE — Telephone Encounter (Signed)
 FYI Only or Action Required?: Action required by provider: would like an xray ordered for the right hand pinky finger.  Patient was last seen in primary care on 06/24/2023 by Mercer Clotilda SAUNDERS, MD.  Called Nurse Triage reporting Hand Pain.  Symptoms began several weeks ago.  Interventions attempted: Nothing.  Symptoms are: gradually worsening.  Triage Disposition: No disposition on file.  Patient/caregiver understands and will follow disposition?:  States will go to UC if she doesn't hear back from PCP around noon today.  Copied from CRM [id number]. Topic: Clinical - Request for Lab/Test Order >> Jul 23, 2023  9:04 AM Mia F wrote: Reason for CRM: Pt calling in stating that her pinky finger has been hurting for two weeks. Pt called in wanting an order but due to pain and swelling I am completing a warm transfer to nurse triage Reason for Disposition  [1] MODERATE pain (e.g., interferes with normal activities) AND [2] present > 3 days  Answer Assessment - Initial Assessment Questions 1. ONSET: When did the pain start?      Two weeks ago; pinky finger on right hand 2. LOCATION and RADIATION: Where is the pain located?  (e.g., fingertip, around nail, joint, entire      Pinky finger on right hand; limited movement, and is swollen 3. SEVERITY: How bad is the pain? What does it keep you from doing?   (Scale 1-10; or mild, moderate, severe)     6/10 4. APPEARANCE: What does the finger look like? (e.g., redness, swelling, bruising, pallor)     Swollen 5. WORK OR EXERCISE: Has there been any recent work or exercise that involved this part (i.e., fingers or hand) of the body?     denies 6. CAUSE: What do you think is causing the pain?     denies 7. AGGRAVATING FACTORS: What makes the pain worse? (e.g., using computer)     Movement makes it worse 8. OTHER SYMPTOMS: Do you have any other symptoms? (e.g., fever, neck pain, numbness)     no 9. PREGNANCY: Is there any chance you  are pregnant? When was your last menstrual period?     na  Protocols used: Finger Pain-A-AH

## 2023-07-23 NOTE — Telephone Encounter (Signed)
 Patient has appt 7/14

## 2023-07-26 ENCOUNTER — Ambulatory Visit: Admitting: Family Medicine

## 2023-07-28 ENCOUNTER — Ambulatory Visit: Admitting: Family Medicine

## 2023-07-28 NOTE — Progress Notes (Incomplete)
 ACUTE VISIT No chief complaint on file.  HPI: [name] is a 33 y.o. female, a patient of Dr. Clotilda Single, who was unavailable today, with a PMHx significant for Chronic Constipation; Weight Gain, who is here today complaining of Right Hand Pain, 5th Finger; Mass, Left Wrist.  Right Hand Pain, 5th Finger: According to the triage note, this began two weeks ago and has gradually worsened over time with swelling and limited ROM. Pain was rated 6/10 and is aggravated with movement. Denied any recent injury.  Soft Tissue Mass, Left Wrist:   Review of Systems See other pertinent positives and negatives in HPI.  Current Outpatient Medications on File Prior to Visit  Medication Sig Dispense Refill   Biotin 5000 MCG CAPS Take by mouth.     calcium carbonate (TITRALAC) 420 MG CHEW chewable tablet Chew 840 mg by mouth daily.     Cholecalciferol (VITAMIN D3) 250 MCG (10000 UT) capsule Take 10,000 Units by mouth daily.     cyanocobalamin  (VITAMIN B12) 1000 MCG tablet Take 1,000 mcg by mouth daily.     escitalopram (LEXAPRO) 10 MG tablet Take 10 mg by mouth daily.     linaclotide  (LINZESS ) 145 MCG CAPS capsule Take 1 capsule (145 mcg total) by mouth daily before breakfast. 90 capsule 3   lisdexamfetamine (VYVANSE ) 30 MG capsule Take 1 capsule (30 mg total) by mouth daily. 30 capsule 0   lisdexamfetamine (VYVANSE ) 40 MG capsule Take 1 capsule (40 mg total) by mouth every morning. 30 capsule 0   lisdexamfetamine (VYVANSE ) 40 MG capsule Take 1 capsule (40 mg total) by mouth every morning. 30 capsule 0   [START ON 08/23/2023] lisdexamfetamine (VYVANSE ) 40 MG capsule Take 1 capsule (40 mg total) by mouth every morning. 30 capsule 0   magnesium citrate SOLN Take 200 mLs by mouth once.     melatonin 5 MG TABS Take 5 mg by mouth.     Multiple Vitamins-Minerals (MULTIVITAMIN WOMEN PO) Take 2 each by mouth daily.     omeprazole (PRILOSEC OTC) 20 MG tablet Take 20 mg by mouth daily.      Probiotic Product (PROBIOTIC-10) CHEW Chew 1 each by mouth daily.     PSYLLIUM HUSK PO Take by mouth.     No current facility-administered medications on file prior to visit.    No past medical history on file. No Known Allergies  Social History   Socioeconomic History   Marital status: Single    Spouse name: Not on file   Number of children: Not on file   Years of education: Not on file   Highest education level: Not on file  Occupational History   Not on file  Tobacco Use   Smoking status: Former    Types: Cigarettes   Smokeless tobacco: Never  Vaping Use   Vaping status: Never Used  Substance and Sexual Activity   Alcohol use: Yes    Comment: weekly   Drug use: Yes    Types: Marijuana   Sexual activity: Not on file  Other Topics Concern   Not on file  Social History Narrative   Not on file   Social Drivers of Health   Financial Resource Strain: Not on file  Food Insecurity: Not on file  Transportation Needs: Not on file  Physical Activity: Not on file  Stress: Not on file  Social Connections: Not on file    There were no vitals filed for this visit. There is no height or weight  on file to calculate BMI.  Physical Exam  ASSESSMENT AND PLAN: [name] was seen here today for Right Hand Pain, 5th Finger; Mass, Left Wrist.  There are no diagnoses linked to this encounter.  No follow-ups on file. I,Emily Lagle,acting as a Neurosurgeon for Betty Swaziland, MD.,have documented all relevant documentation on the behalf of Betty [place], MD,as directed by  Betty Sw[place]iland, MD while in the presence of Betty [place], MD.  *** (refresh reminder)  I, Betty Sw[place], MD, have reviewed all documentation for this visit. The documentation on 07/28/23 for the exam, diagnosis, procedures, and orders are all accurate and complete. Betty G. Swaziland, MD  [facility]. [place] office.  Discharge Instructions   None

## 2023-07-29 ENCOUNTER — Ambulatory Visit: Payer: Self-pay

## 2023-07-29 NOTE — Telephone Encounter (Signed)
 FYI Only or Action Required?: FYI only for provider.  Patient was last seen in primary care on 06/24/2023 by Mercer Clotilda SAUNDERS, MD.  Called Nurse Triage reporting Hand Pain.  Symptoms began several days ago.  Interventions attempted: OTC medications: aleve without relief.  Symptoms are: unchanged.  Triage Disposition: See Physician Within 24 Hours  Patient/caregiver understands and will follow disposition?: Yes     Copied from CRM [id number]. Topic: Clinical - Red Word Triage >> Jul 29, 2023 10:11 AM Mesmerise C wrote: [facility] that prompted transfer to Nurse Triage: Patient is experiencing pain in right hand would like an xray for today at possible patient stated her appointment was supposed to be for today and was scheduled yesterday and she didn't show but needs to be seen as soon as possible due to pain Reason for Disposition  [1] SEVERE pain (e.g., excruciating, unable to use hand at all) AND [2] not improved after 2 hours of pain medicine  Answer Assessment - Initial Assessment Questions 1. ONSET: When did the pain start?     See alternate NT note 2. LOCATION: Where is the pain located?     R pinky, wrist 3. PAIN: How bad is the pain? (Scale 1-10; or mild, moderate, severe)   - MILD (1-3): doesn't interfere with normal activities   - MODERATE (4-7): interferes with normal activities (e.g., work or school) or awakens from sleep   - SEVERE (8-10): excruciating pain, unable to use hand at all     Moderate - took aleve without relief  7. OTHER SYMPTOMS: Do you have any other symptoms? (e.g., neck pain, swelling, rash, numbness, fever)     Swollen wrist    Pt was triaged 7/11 for same acute sx -- Pt reports there was confusion with the acute appt made and thought it was supposed to be today but found out it was actually yesterday.    Pt reports the only worsening factors are pain and wrist swelling and requesting imaging. Triager attempted to schedule with PCP but no  access. Triager offered to schedule with [facility], but pt declined and stated that she will make the appt herself.  Protocols used: Hand and Wrist Pain-A-AH

## 2024-01-19 ENCOUNTER — Encounter: Payer: Self-pay | Admitting: Orthopedic Surgery

## 2024-01-19 ENCOUNTER — Ambulatory Visit: Admitting: Orthopedic Surgery

## 2024-01-19 ENCOUNTER — Other Ambulatory Visit (INDEPENDENT_AMBULATORY_CARE_PROVIDER_SITE_OTHER): Payer: Self-pay

## 2024-01-19 DIAGNOSIS — M545 Low back pain, unspecified: Secondary | ICD-10-CM

## 2024-01-19 NOTE — Progress Notes (Signed)
 "  Office Visit Note   Patient: Karen Martin           Date of Birth: 03/02/1990           MRN: [id number] Visit Date: 01/19/2024 Requested by: Mercer Clotilda SAUNDERS, MD [address] [place],  [place] [address] PCP: Mercer Clotilda SAUNDERS, MD  Subjective: Chief Complaint  Patient presents with   Lower Back - Pain    HPI: Karen Martin is a 34 y.o. female who presents to the office reporting low back pain after back injury when she fell during a half marathon in January 2023.  She has had Medrol  Dosepak 10/01/2023 which is not helpful.  She was seen by urgent care at that time.  Pain is continued to increase since November 2025.  Denies any numbness and tingling or radicular leg pain but the pain in her lower back and upper sacrum does wake her from sleep at night.  3-4 nights out of 7.  She has to sleep with a pillow between her legs.  Does report some pain with bending.  Patient has less pain during the day.  Sometimes the pain is manageable and it does fluctuate.  However since October the pain has become much more intense.  HEP: Over-the-counter medication not helpful.  Tiger balm and heat helps some.  She has done some stretching without much relief as well as a home exercise type program.  She works at [employer].  Doing sitting and standing for too long is problematic.  Does hurt her to cough and sneeze..                ROS: All systems reviewed are negative as they relate to the chief complaint within the history of present illness.  Patient denies fevers or chills.  Assessment & Plan: Visit Diagnoses:  1. Low back pain, unspecified back pain laterality, unspecified chronicity, unspecified whether sciatica present     Plan: Impression is low back pain 2 years duration with normal radiographs and no radicular symptoms.  MRI indicated to evaluate for possible pars defect or central disc.SABRA  Symptoms ongoing for longer than 2 years and worsening.  Will see what the MRI  scan shows and proceed from there.  Follow-Up Instructions: No follow-ups on file.   Orders:  Orders Placed This Encounter  Procedures   XR Lumbar Spine 2-3 Views   MR Lumbar Spine w/o contrast   No orders of the defined types were placed in this encounter.     Procedures: No procedures performed   Clinical Data: No additional findings.  Objective: Vital Signs: There were no vitals taken for this visit.  Physical Exam:  Constitutional: Patient appears well-developed HEENT:  Head: Normocephalic Eyes:EOM are normal Neck: Normal range of motion Cardiovascular: Normal rate Pulmonary/chest: Effort normal Neurologic: Patient is alert Skin: Skin is warm Psychiatric: Patient has normal mood and affect  Ortho Exam: Ortho exam demonstrates full active and passive range of motion of the knees ankles and hips.  No nerve root tension signs.  Does have some focal pain in the lower lumbosacral region.  Does have some pain forward and lateral bending but no trochanteric tenderness.  No muscle atrophy in either leg.  Gait is normal.  Specialty Comments:  No specialty comments available.  Imaging: No results found.   PMFS History: Patient Active Problem List   Diagnosis Date Noted   Chronic constipation 07/22/2021   Weight gain 01/16/2021  History reviewed. No pertinent past medical history.  Family History  Problem Relation Age of Onset   Prostate cancer Father 45   Lung cancer Paternal Grandmother    Colon cancer Neg Hx    Esophageal cancer Neg Hx    Stomach cancer Neg Hx     Past Surgical History:  Procedure Laterality Date   WISDOM TOOTH EXTRACTION     Social History   Occupational History   Not on file  Tobacco Use   Smoking status: Former    Types: Cigarettes   Smokeless tobacco: Never  Vaping Use   Vaping status: Never Used  Substance and Sexual Activity   Alcohol use: Yes    Comment: weekly   Drug use: Yes    Types: Marijuana   Sexual activity:  Not on file        "

## 2024-01-21 ENCOUNTER — Ambulatory Visit: Admitting: Family Medicine

## 2024-01-21 ENCOUNTER — Encounter: Payer: Self-pay | Admitting: Family Medicine

## 2024-01-21 VITALS — BP 126/84 | HR 78 | Temp 98.7°F | Ht 72.0 in | Wt 222.0 lb

## 2024-01-21 DIAGNOSIS — Z6830 Body mass index (BMI) 30.0-30.9, adult: Secondary | ICD-10-CM

## 2024-01-21 DIAGNOSIS — R635 Abnormal weight gain: Secondary | ICD-10-CM

## 2024-01-21 DIAGNOSIS — E66811 Obesity, class 1: Secondary | ICD-10-CM

## 2024-01-21 MED ORDER — ZEPBOUND 2.5 MG/0.5ML ~~LOC~~ SOAJ: 2.5000 mg | SUBCUTANEOUS | 0 refills | Status: DC

## 2024-01-21 MED ORDER — ZEPBOUND 5 MG/0.5ML ~~LOC~~ SOAJ: 5.0000 mg | SUBCUTANEOUS | 1 refills | Status: AC

## 2024-01-21 NOTE — Progress Notes (Signed)
 "  Established Patient Office Visit   Subjective  Patient ID: Karen Martin, female    DOB: 1990-05-07  Age: 34 y.o. MRN: [id number]  Chief Complaint  Patient presents with   Medical Management of Chronic Issues    Patient came in today for weight loss medication     Pt is a 34 year old female seen for ongoing concern.  Patient endorses continued weight gain despite exercising several times per week, eating fairly clean, cooking at home.  Weight gain noted over the last year and a half.  Workup for endocrine causes negative.  At times weight affects mood.  Increased weight causing back pain.  No family history of medullary thyroid  cancer.  No personal history of pancreatitis, gallbladder disease, liver disease.  Patient does have a history of IBS-C that is currently controlled.    Patient Active Problem List   Diagnosis Date Noted   Chronic constipation 07/22/2021   Weight gain 01/16/2021   Past Medical History:  Diagnosis Date   Anxiety 2017   Depression 2010   Past Surgical History:  Procedure Laterality Date   WISDOM TOOTH EXTRACTION     Social History[1] Family History  Problem Relation Age of Onset   Prostate cancer Father 22   Alcohol abuse Father    Cancer Father    Hearing loss Father    Lung cancer Paternal Grandmother    Colon cancer Neg Hx    Esophageal cancer Neg Hx    Stomach cancer Neg Hx    Allergies[2]  ROS Negative unless stated above    Objective:     BP 126/84 (BP Location: Left Arm, Patient Position: Sitting, Cuff Size: Large)   Pulse 78   Temp 98.7 F (37.1 C) (Oral)   Ht 6' (1.829 m)   Wt 222 lb (100.7 kg)   LMP 12/20/2023   SpO2 99%   BMI 30.11 kg/m  BP Readings from Last 3 Encounters:  01/21/24 126/84  06/24/23 124/82  05/13/23 138/82   Wt Readings from Last 3 Encounters:  01/21/24 222 lb (100.7 kg)  06/24/23 209 lb 6.4 oz (95 kg)  05/13/23 210 lb 3.2 oz (95.3 kg)      Physical Exam Constitutional:      Appearance:  Normal appearance.  HENT:     Head: Normocephalic and atraumatic.     Nose: Nose normal.     Mouth/Throat:     Mouth: Mucous membranes are moist.  Eyes:     Extraocular Movements: Extraocular movements intact.     Conjunctiva/sclera: Conjunctivae normal.  Cardiovascular:     Rate and Rhythm: Normal rate.  Pulmonary:     Effort: Pulmonary effort is normal.  Skin:    General: Skin is warm and dry.  Neurological:     Mental Status: She is alert and oriented to person, place, and time.  Psychiatric:        Mood and Affect: Mood normal.        Behavior: Behavior normal.        06/24/2023   10:44 AM 05/13/2023   11:15 AM 04/06/2022   11:52 AM  Depression screen PHQ 2/9  Decreased Interest 0 1 1  Down, Depressed, Hopeless 1 1 2   PHQ - 2 Score 1 2 3   Altered sleeping 2 3 2   Tired, decreased energy 2 3 2   Change in appetite 2 3 2   Feeling bad or failure about yourself   1 2  Trouble concentrating 1 3 2   Moving  slowly or fidgety/restless 0 1 0  Suicidal thoughts 0 0 0  PHQ-9 Score 8  16  13    Difficult doing work/chores Somewhat difficult Very difficult Somewhat difficult     Data saved with a previous flowsheet row definition      06/24/2023   10:45 AM 05/13/2023   11:16 AM 04/06/2022   11:53 AM 09/01/2019    3:50 PM  GAD 7 : Generalized Anxiety Score  Nervous, Anxious, on Edge 2 1 2 1   Control/stop worrying 2 1 2 1   Worry too much - different things 2 2 2 1   Trouble relaxing 2 1 2 1   Restless 0 2 2 0  Easily annoyed or irritable 1 1 0 1  Afraid - awful might happen 0 0 1 2  Total GAD 7 Score 9 8 11 7   Anxiety Difficulty Somewhat difficult  Somewhat difficult Somewhat difficult     No results found for any visits on 01/21/24.    Assessment & Plan:   Class 1 obesity without serious comorbidity with body mass index (BMI) of 30.0 to 30.9 in adult, unspecified obesity type -     Zepbound ; Inject 2.5 mg into the skin once a week.  Dispense: 2 mL; Refill: 0 -      Zepbound ; Inject 5 mg into the skin once a week.  Dispense: 2 mL; Refill: 1  Weight gain -     Zepbound ; Inject 2.5 mg into the skin once a week.  Dispense: 2 mL; Refill: 0   Continued weight gain despite lifestyle modifications.  Current wt 222 lbs. Body mass index is 30.11 kg/m.  Discussed various weight management options.  Start Zepbound  2.5 mg/wk  Discussed r/b/a.  F/u in 3 months, notify clinic sooner if needed.  Return in about 3 months (around 04/20/2024).   Clotilda JONELLE Single, MD    [1]  Social History Tobacco Use   Smoking status: Former    Types: Cigarettes   Smokeless tobacco: Never  Vaping Use   Vaping status: Never Used  Substance Use Topics   Alcohol use: Yes    Comment: weekly   Drug use: Yes    Types: Marijuana  [2] No Known Allergies  "

## 2024-01-24 ENCOUNTER — Encounter: Payer: Self-pay | Admitting: Orthopedic Surgery

## 2024-01-26 ENCOUNTER — Telehealth: Payer: Self-pay

## 2024-01-26 NOTE — Telephone Encounter (Signed)
 Copied from CRM [id number]. Topic: Clinical - Medication Prior Auth >> Jan 25, 2024  4:40 PM Karen Martin wrote: Reason for CRM: Pt is calling to request a prior auth for zepbound . She would like a call back and a message in mychart once the results of the auth come back so she knows. #[id number]

## 2024-02-02 ENCOUNTER — Other Ambulatory Visit

## 2024-02-03 NOTE — Telephone Encounter (Signed)
 Agent will let pt know PA department has a lot of PA they are working on

## 2024-02-10 ENCOUNTER — Telehealth: Payer: Self-pay

## 2024-02-10 ENCOUNTER — Other Ambulatory Visit: Payer: Self-pay | Admitting: Family Medicine

## 2024-02-10 ENCOUNTER — Other Ambulatory Visit (HOSPITAL_COMMUNITY): Payer: Self-pay

## 2024-02-10 DIAGNOSIS — F900 Attention-deficit hyperactivity disorder, predominantly inattentive type: Secondary | ICD-10-CM

## 2024-02-10 MED ORDER — LISDEXAMFETAMINE DIMESYLATE 40 MG PO CAPS: 40.0000 mg | ORAL_CAPSULE | ORAL | 0 refills | Status: AC

## 2024-02-10 NOTE — Telephone Encounter (Signed)
 Copied from CRM #[id number]. Topic: Clinical - Medication Refill >> Feb 10, 2024  9:39 AM Kevelyn M wrote: Medication: lisdexamfetamine  (VYVANSE ) 40 MG capsule  Has the patient contacted their pharmacy? Yes (Agent: If no, request that the patient contact the pharmacy for the refill. If patient does not wish to contact the pharmacy document the reason why and proceed with request.) (Agent: If yes, when and what did the pharmacy advise?)  This is the patient's preferred pharmacy:  [id number] - [place], [place] - [address] AT [facility] [address] [place] [address] Phone: [phone] Fax: [phone]  Is this the correct pharmacy for this prescription? Yes If no, delete pharmacy and type the correct one.   Has the prescription been filled recently? No  Is the patient out of the medication? Yes  Has the patient been seen for an appointment in the last year OR does the patient have an upcoming appointment? Yes  Can we respond through MyChart? Yes  Agent: Please be advised that Rx refills may take up to 3 business days. We ask that you follow-up with your pharmacy.

## 2024-02-10 NOTE — Telephone Encounter (Signed)
 Refilled

## 2024-02-10 NOTE — Telephone Encounter (Signed)
Pt is calling checking on the status of PA

## 2024-02-10 NOTE — Telephone Encounter (Signed)
 Called patient left a VM to return call about zepbound 

## 2024-02-11 NOTE — Telephone Encounter (Signed)
 Called  patient and left Vm about Zepbound , want to see if patient would like to send med over to [facility]

## 2024-02-16 ENCOUNTER — Telehealth: Payer: Self-pay

## 2024-02-16 DIAGNOSIS — Z6830 Body mass index (BMI) 30.0-30.9, adult: Secondary | ICD-10-CM

## 2024-02-16 DIAGNOSIS — R635 Abnormal weight gain: Secondary | ICD-10-CM

## 2024-02-16 MED ORDER — ZEPBOUND 2.5 MG/0.5ML ~~LOC~~ SOAJ: 2.5000 mg | SUBCUTANEOUS | 0 refills | Status: AC

## 2024-02-16 NOTE — Addendum Note (Signed)
 Addended by: BRIEN SONG A on: 02/16/2024 04:26 PM   Modules accepted: Orders

## 2024-02-16 NOTE — Telephone Encounter (Signed)
 Copied from CRM [id number]. Topic: General - Call Back - No Documentation >> Feb 16, 2024  3:06 PM Alfonso ORN wrote: Reason for CRM: Reason for CRM: Reason for CRM: pt called to return call from [facility]. Relayed message. Pt is unsure of what [employer] is please call pt back to update.

## 2024-02-16 NOTE — Telephone Encounter (Signed)
 Copied from CRM [id number]. Topic: Clinical - Medication Prior Auth >> Jan 25, 2024  4:40 PM Karen Martin wrote: Reason for CRM: Pt is calling to request a prior auth for zepbound . She would like a call back and a message in mychart once the results of the auth come back so she knows. #[id number]

## 2024-02-16 NOTE — Telephone Encounter (Addendum)
 Called patient left a VM to return call to see if patient would like to try [employer] [facility]
